# Patient Record
Sex: Female | Born: 1975 | Race: White | Hispanic: No | Smoking: Never smoker
Health system: Southern US, Community
[De-identification: ages and names within clinical notes are randomized; demographics above are authoritative.]

## PROBLEM LIST (undated history)

## (undated) DIAGNOSIS — E079 Disorder of thyroid, unspecified: Secondary | ICD-10-CM

## (undated) DIAGNOSIS — F32A Depression, unspecified: Secondary | ICD-10-CM

## (undated) DIAGNOSIS — F988 Other specified behavioral and emotional disorders with onset usually occurring in childhood and adolescence: Secondary | ICD-10-CM

## (undated) DIAGNOSIS — T7840XA Allergy, unspecified, initial encounter: Secondary | ICD-10-CM

## (undated) DIAGNOSIS — F419 Anxiety disorder, unspecified: Secondary | ICD-10-CM

## (undated) DIAGNOSIS — J45909 Unspecified asthma, uncomplicated: Secondary | ICD-10-CM

## (undated) HISTORY — DX: Other specified behavioral and emotional disorders with onset usually occurring in childhood and adolescence: F98.8

## (undated) HISTORY — DX: Unspecified asthma, uncomplicated: J45.909

## (undated) HISTORY — DX: Disorder of thyroid, unspecified: E07.9

## (undated) HISTORY — PX: BREAST BIOPSY: SHX20

## (undated) HISTORY — DX: Anxiety disorder, unspecified: F41.9

## (undated) HISTORY — DX: Depression, unspecified: F32.A

## (undated) HISTORY — PX: CHOLECYSTECTOMY: SHX55

## (undated) HISTORY — DX: Allergy, unspecified, initial encounter: T78.40XA

---

## 2005-05-01 ENCOUNTER — Ambulatory Visit: Payer: Self-pay | Admitting: Unknown Physician Specialty

## 2005-05-08 ENCOUNTER — Ambulatory Visit: Payer: Self-pay | Admitting: Surgery

## 2006-07-02 ENCOUNTER — Emergency Department: Payer: Self-pay | Admitting: Emergency Medicine

## 2006-07-02 ENCOUNTER — Other Ambulatory Visit: Payer: Self-pay

## 2006-12-24 ENCOUNTER — Emergency Department: Payer: Self-pay | Admitting: Emergency Medicine

## 2011-11-26 ENCOUNTER — Emergency Department: Payer: Self-pay | Admitting: *Deleted

## 2011-11-26 LAB — CBC
HCT: 44.3 % (ref 35.0–47.0)
HGB: 15 g/dL (ref 12.0–16.0)
MCHC: 33.8 g/dL (ref 32.0–36.0)
MCV: 87 fL (ref 80–100)
Platelet: 257 10*3/uL (ref 150–440)
RBC: 5.13 10*6/uL (ref 3.80–5.20)
WBC: 8.4 10*3/uL (ref 3.6–11.0)

## 2011-11-26 LAB — COMPREHENSIVE METABOLIC PANEL
Albumin: 3.9 g/dL (ref 3.4–5.0)
Alkaline Phosphatase: 45 U/L — ABNORMAL LOW (ref 50–136)
Bilirubin,Total: 0.4 mg/dL (ref 0.2–1.0)
Co2: 26 mmol/L (ref 21–32)
Creatinine: 0.7 mg/dL (ref 0.60–1.30)
Glucose: 106 mg/dL — ABNORMAL HIGH (ref 65–99)
Osmolality: 274 (ref 275–301)
Potassium: 3.5 mmol/L (ref 3.5–5.1)
SGPT (ALT): 24 U/L

## 2011-11-26 LAB — URINALYSIS, COMPLETE
Bacteria: NONE SEEN
Bilirubin,UR: NEGATIVE
Glucose,UR: NEGATIVE mg/dL (ref 0–75)
Ketone: NEGATIVE
Leukocyte Esterase: NEGATIVE
Ph: 7 (ref 4.5–8.0)
Protein: NEGATIVE
Squamous Epithelial: 1
WBC UR: NONE SEEN /HPF (ref 0–5)

## 2012-03-20 ENCOUNTER — Emergency Department: Payer: Self-pay | Admitting: Emergency Medicine

## 2012-03-20 LAB — BASIC METABOLIC PANEL
Anion Gap: 11 (ref 7–16)
BUN: 7 mg/dL (ref 7–18)
Chloride: 106 mmol/L (ref 98–107)
Creatinine: 0.62 mg/dL (ref 0.60–1.30)
EGFR (African American): >60
EGFR (Non-African Amer.): >60
Glucose: 85 mg/dL (ref 65–99)
Osmolality: 280 (ref 275–301)
Sodium: 142 mmol/L (ref 136–145)

## 2012-03-20 LAB — CBC
HCT: 43.4 % (ref 35.0–47.0)
HGB: 14.8 g/dL (ref 12.0–16.0)
MCH: 29.2 pg (ref 26.0–34.0)
MCHC: 34.2 g/dL (ref 32.0–36.0)
RBC: 5.08 10*6/uL (ref 3.80–5.20)

## 2012-03-20 LAB — CK TOTAL AND CKMB (NOT AT ARMC)
CK, Total: 155 U/L (ref 21–215)
CK-MB: 4.8 ng/mL — ABNORMAL HIGH (ref 0.5–3.6)

## 2012-03-20 LAB — TROPONIN I: Troponin-I: <0.02 ng/mL

## 2012-07-02 ENCOUNTER — Encounter: Payer: Self-pay | Admitting: Family Medicine

## 2012-07-02 ENCOUNTER — Ambulatory Visit (INDEPENDENT_AMBULATORY_CARE_PROVIDER_SITE_OTHER): Payer: 59 | Admitting: Family Medicine

## 2012-07-02 VITALS — BP 120/80 | HR 88 | Temp 97.9°F | Ht 64.25 in | Wt 146.0 lb

## 2012-07-02 DIAGNOSIS — F411 Generalized anxiety disorder: Secondary | ICD-10-CM

## 2012-07-02 DIAGNOSIS — F419 Anxiety disorder, unspecified: Secondary | ICD-10-CM

## 2012-07-02 DIAGNOSIS — Z1231 Encounter for screening mammogram for malignant neoplasm of breast: Secondary | ICD-10-CM

## 2012-07-02 DIAGNOSIS — Z9109 Other allergy status, other than to drugs and biological substances: Secondary | ICD-10-CM

## 2012-07-02 DIAGNOSIS — F32A Depression, unspecified: Secondary | ICD-10-CM | POA: Insufficient documentation

## 2012-07-02 DIAGNOSIS — Z0000 Encounter for general adult medical examination without abnormal findings: Secondary | ICD-10-CM

## 2012-07-02 DIAGNOSIS — Z136 Encounter for screening for cardiovascular disorders: Secondary | ICD-10-CM

## 2012-07-02 DIAGNOSIS — F329 Major depressive disorder, single episode, unspecified: Secondary | ICD-10-CM | POA: Insufficient documentation

## 2012-07-02 DIAGNOSIS — Z309 Encounter for contraceptive management, unspecified: Secondary | ICD-10-CM

## 2012-07-02 DIAGNOSIS — T7840XA Allergy, unspecified, initial encounter: Secondary | ICD-10-CM | POA: Insufficient documentation

## 2012-07-02 DIAGNOSIS — Z889 Allergy status to unspecified drugs, medicaments and biological substances status: Secondary | ICD-10-CM

## 2012-07-02 LAB — LIPID PANEL
Cholesterol: 224 mg/dL — ABNORMAL HIGH (ref 0–200)
HDL: 48.3 mg/dL (ref >39.00)
Triglycerides: 288 mg/dL — ABNORMAL HIGH (ref 0.0–149.0)
VLDL: 57.6 mg/dL — ABNORMAL HIGH (ref 0.0–40.0)

## 2012-07-02 LAB — COMPREHENSIVE METABOLIC PANEL
BUN: 11 mg/dL (ref 6–23)
CO2: 26 mEq/L (ref 19–32)
Calcium: 9.2 mg/dL (ref 8.4–10.5)
Chloride: 103 mEq/L (ref 96–112)
Creatinine, Ser: 0.7 mg/dL (ref 0.4–1.2)
GFR: 95.58 mL/min (ref >60.00)
Glucose, Bld: 102 mg/dL — ABNORMAL HIGH (ref 70–99)
Total Bilirubin: 0.4 mg/dL (ref 0.3–1.2)

## 2012-07-02 MED ORDER — FLUTICASONE PROPIONATE 50 MCG/ACT NA SUSP: 2.0000 | Freq: Every day | NASAL | Status: DC

## 2012-07-02 MED ORDER — ALPRAZOLAM 0.25 MG PO TABS: 0.2500 mg | ORAL_TABLET | Freq: Two times a day (BID) | ORAL | Status: DC | PRN

## 2012-07-02 MED ORDER — TRAZODONE HCL 50 MG PO TABS: 50.0000 mg | ORAL_TABLET | Freq: Every day | ORAL | Status: DC

## 2012-07-02 MED ORDER — LEVONORGESTREL-ETHINYL ESTRAD 0.15-30 MG-MCG PO TABS: 1.0000 | ORAL_TABLET | Freq: Every day | ORAL | Status: DC

## 2012-07-02 MED ORDER — FEXOFENADINE-PSEUDOEPHED ER 180-240 MG PO TB24: 1.0000 | ORAL_TABLET | Freq: Every day | ORAL | Status: DC

## 2012-07-02 NOTE — Patient Instructions (Addendum)
It was so nice to meet you. We will call you with your lab results. Please stop by to see Joanna Walker on your way out to set up your mammogram and Allergist referral.  Please call me in a few months to let me know how the birth control is working.

## 2012-07-02 NOTE — Progress Notes (Signed)
Subjective:    Patient ID: Joanna Walker, female    DOB: 1975-08-01, 37 y.o.   MRN: [id number]  HPI Very pleasant 37 yo G2P2 here for CPX and to establish care.  Per pt, had normal pap smear in 07/2011.  No h/o abnormal pap smears.  Has been on same OCP for years and frequently experiences spotting throughout the month.  Would like to try another OCP.  Allergies- per pt, has severe allergies to year round allergens and she is not quite sure what her triggers are.  Taking Allegra D but still has constant drainage and pressure.  No fever, cough or wheezes.  Has never been to an allergist.  Anxiety- h/o panic attacks.  She weaned herself off of Lexapro.  Currently going through a divorce and actually feels less anxious now.  Her two teenage sons are handling it well.  Has not had a panic attack in months.  Denies feeling anxious or depressed.  Patient Active Problem List  Diagnosis  . Anxiety  . Allergy  . Routine general medical examination at a health care facility   Past Medical History  Diagnosis Date  . Anxiety   . ADD (attention deficit disorder)   . Allergy    No past surgical history on file. History  Substance Use Topics  . Smoking status: Never Smoker   . Smokeless tobacco: Not on file  . Alcohol Use: Not on file   Family History  Problem Relation Age of Onset  . Adopted: Yes   No Known Allergies Current Outpatient Prescriptions on File Prior to Visit  Medication Sig Dispense Refill  . fluticasone (FLONASE) 50 MCG/ACT nasal spray Place 2 sprays into the nose daily.  16 g  2  . levonorgestrel-ethinyl estradiol (NORDETTE) 0.15-30 MG-MCG tablet Take 1 tablet by mouth daily.  1 Package  11  . traZODone (DESYREL) 50 MG tablet Take 1 tablet (50 mg total) by mouth at bedtime.  30 tablet  3   The PMH, PSH, Social History, Family History, Medications, and allergies have been reviewed in [facility], and have been updated if relevant.    Review of Systems    See HPI NO  dysuria No CP or SOB No changes in bowel habits No heat or cold intolerance No fatigue  Objective:   Physical Exam BP 120/80  Pulse 88  Temp 97.9 F (36.6 C)  Ht 5' 4.25" (1.632 m)  Wt 146 lb (66.225 kg)  BMI 24.87 kg/m2  General:  Well-developed,well-nourished,in no acute distress; alert,appropriate and cooperative throughout examination Head:  normocephalic and atraumatic.   Eyes:  vision grossly intact, pupils equal, pupils round, and pupils reactive to light.   Ears:  R ear normal and L ear normal.   Nose:  no external deformity.   +boggy turbinates, no sinus tenderness Mouth:  good dentition.   Neck:  No deformities, masses, or tenderness noted. Breasts:  No mass, nodules, thickening, tenderness, bulging, retraction, inflamation, nipple discharge or skin changes noted.   Lungs:  Normal respiratory effort, chest expands symmetrically. Lungs are clear to auscultation, no crackles or wheezes. Heart:  Normal rate and regular rhythm. S1 and S2 normal without gallop, murmur, click, rub or other extra sounds. Abdomen:  Bowel sounds positive,abdomen soft and non-tender without masses, organomegaly or hernias noted. Msk:  No deformity or scoliosis noted of thoracic or lumbar spine.   Extremities:  No clubbing, cyanosis, edema, or deformity noted with normal full range of motion of all joints.  Neurologic:  alert & oriented X3 and gait normal.   Skin:  Intact without suspicious lesions or rashes Cervical Nodes:  No lymphadenopathy noted Axillary Nodes:  No palpable lymphadenopathy Psych:  Cognition and judgment appear intact. Alert and cooperative with normal attention span and concentration. No apparent delusions, illusions, hallucinations        Assessment & Plan:   1. Other screening mammogram  Since she is adopted and does not know her family history, will refer for mammogram. MM Digital Screening  2. Routine general medical examination at a health care facility  Reviewed  preventive care protocols, scheduled due services, and updated immunizations Discussed nutrition, exercise, diet, and healthy lifestyle.  Due for pap smear in 2015.  Comprehensive metabolic panel  3. Screening for ischemic heart disease  Lipid Panel  4. Multiple allergies  Add flonase to antihistamine. Refer to allergist for evaluation/allergy testing. Ambulatory referral to Allergy  5. Anxiety  Stable off rx. Rx given for prn xanax for panic attacks.   6. Contraception management  Increase dose of estrogen to decrease spotting.  Follow up by phone in 2 months. The patient indicates understanding of these issues and agrees with the plan.

## 2012-07-16 ENCOUNTER — Telehealth: Payer: Self-pay

## 2012-07-16 MED ORDER — DIAZEPAM 5 MG PO TABS: 5.0000 mg | ORAL_TABLET | Freq: Two times a day (BID) | ORAL | Status: DC | PRN

## 2012-07-16 NOTE — Telephone Encounter (Signed)
Please call in rx for valium as entered below. 

## 2012-07-16 NOTE — Telephone Encounter (Signed)
Pt left v/m has had several panic attacks this week; pt saw Dr Dayton Martes 07/02/12 and given alprazolam for panic attacks. Pt took 2 alprazolam yesterday after panic attack and it really did not help. Pt request a different med for panic attacks to [facility] Pt said Dr Dayton Martes had said if alprazolam did not work would call in Valium.Please advise.pt request call back today.

## 2012-07-16 NOTE — Telephone Encounter (Signed)
[facility], advised patient.

## 2012-07-20 ENCOUNTER — Telehealth: Payer: Self-pay

## 2012-07-20 MED ORDER — AMPHETAMINE-DEXTROAMPHETAMINE 20 MG PO TABS: 20.0000 mg | ORAL_TABLET | Freq: Three times a day (TID) | ORAL | Status: DC

## 2012-07-20 NOTE — Telephone Encounter (Signed)
Pt left vm and pt seen recently and pt said discussed with Dr Dayton Martes about not taking ADD med for awhile to see how she does. Pt said not working well; pt not able to focus;pt taking courses at work and needs med for ADD. Before making appt since she had discussed with Dr Dayton Martes previously wanted note sent to Dr Dayton Martes to see if med could be given without being seen again.Please advise.

## 2012-07-20 NOTE — Telephone Encounter (Signed)
Rx printed

## 2012-07-20 NOTE — Telephone Encounter (Signed)
Script placed on doctor's desk for signature.  Pt wants to pick this up early tomorrow morning.

## 2012-07-20 NOTE — Telephone Encounter (Signed)
We do not have name or dosage of medication she was taking in our system.  What was she taking?

## 2012-07-20 NOTE — Telephone Encounter (Signed)
Pt states she was taking short acting adderall 20 mg's, one three times a day.  States this dose worked for her.

## 2012-07-21 NOTE — Telephone Encounter (Signed)
Rx signed and on my desk

## 2012-07-21 NOTE — Telephone Encounter (Signed)
Script given to patient

## 2012-07-30 ENCOUNTER — Ambulatory Visit: Payer: 59

## 2012-08-07 ENCOUNTER — Ambulatory Visit: Payer: 59

## 2012-08-11 ENCOUNTER — Ambulatory Visit: Payer: 59

## 2012-08-18 ENCOUNTER — Other Ambulatory Visit: Payer: Self-pay | Admitting: Family Medicine

## 2012-08-18 NOTE — Telephone Encounter (Signed)
[facility] 

## 2012-08-24 ENCOUNTER — Other Ambulatory Visit: Payer: Self-pay

## 2012-08-24 MED ORDER — AMPHETAMINE-DEXTROAMPHETAMINE 20 MG PO TABS: 20.0000 mg | ORAL_TABLET | Freq: Three times a day (TID) | ORAL | Status: DC

## 2012-08-24 NOTE — Telephone Encounter (Signed)
pt left v/m requesting rx adderall 20 mg. Call when ready for pick up. Pt is out of med and would like to pick up today.Please advise.

## 2012-08-24 NOTE — Telephone Encounter (Signed)
Script placed at front desk for pickup, advised patient.

## 2012-09-02 ENCOUNTER — Ambulatory Visit (INDEPENDENT_AMBULATORY_CARE_PROVIDER_SITE_OTHER): Payer: 59 | Admitting: Family Medicine

## 2012-09-02 ENCOUNTER — Encounter: Payer: Self-pay | Admitting: Family Medicine

## 2012-09-02 VITALS — BP 120/64 | HR 97 | Temp 98.4°F | Ht 64.25 in | Wt 145.0 lb

## 2012-09-02 DIAGNOSIS — M542 Cervicalgia: Secondary | ICD-10-CM

## 2012-09-02 MED ORDER — TIZANIDINE HCL 4 MG PO TABS: 4.0000 mg | ORAL_TABLET | Freq: Every evening | ORAL | Status: DC

## 2012-09-02 MED ORDER — TRAMADOL HCL 50 MG PO TABS: 50.0000 mg | ORAL_TABLET | Freq: Four times a day (QID) | ORAL | Status: DC | PRN

## 2012-09-02 NOTE — Progress Notes (Signed)
Nature conservation officer at [facility] [address] [place] [address] Phone: [phone] Fax: [phone]  Date:  09/02/2012   Name:  Joanna Walker   DOB:  11-30-1975   MRN:  [id number] Gender: female Age: 37 y.o.  Primary Physician:  Ruthe Mannan, MD  Evaluating MD: Hannah Beat, MD   Chief Complaint: Shoulder Pain and Neck Pain   History of Present Illness:  Joanna Walker is a 37 y.o. pleasant patient who presents with the following:  Dr. Dayton Martes asked me to evaluate the patient for neck pain.  Pain in the posterior of the left sided, and he has been seeing a chiropractor in [place] and also in [place].  Dr. Nyra Capes in [place].   L sided neck pain and no real shoulder pain - though originally described as shoulder pain.  She has been doing manipulation intermittently, which has historically helped. She works in a job where she works on [employer] a great deal of time.   No radiculopathy. No trauma.   No painful arc of motion. No prior interventions or surgery.  Patient Active Problem List  Diagnosis  . Anxiety  . Allergy  . Routine general medical examination at a health care facility  . Contraception management    Past Medical History  Diagnosis Date  . Anxiety   . ADD (attention deficit disorder)   . Allergy     No past surgical history on file.  History   Social History  . Marital Status: Legally Separated    Spouse Name: N/A    Number of Children: N/A  . Years of Education: N/A   Occupational History  . Not on file.   Social History Main Topics  . Smoking status: Never Smoker   . Smokeless tobacco: Not on file  . Alcohol Use: Not on file  . Drug Use: Not on file  . Sexually Active: Not on file   Other Topics Concern  . Not on file   Social History Narrative  . No narrative on file    Family History  Problem Relation Age of Onset  . Adopted: Yes    No Known Allergies  Medication list has been reviewed and  updated.  Outpatient Prescriptions Prior to Visit  Medication Sig Dispense Refill  . amphetamine-dextroamphetamine (ADDERALL) 20 MG tablet Take 1 tablet (20 mg total) by mouth 3 (three) times daily.  90 tablet  0  . diazepam (VALIUM) 5 MG tablet TAKE 1 TABLET BY MOUTH EVERY 12 HOURS AS NEEDED FOR ANXIETY  30 tablet  1  . fexofenadine-pseudoephedrine (ALLEGRA-D 24 HOUR) 180-240 MG per 24 hr tablet Take 1 tablet by mouth daily.      . fluticasone (FLONASE) 50 MCG/ACT nasal spray Place 2 sprays into the nose daily.  16 g  2  . levonorgestrel-ethinyl estradiol (NORDETTE) 0.15-30 MG-MCG tablet Take 1 tablet by mouth daily.  1 Package  11  . traZODone (DESYREL) 50 MG tablet Take 1 tablet (50 mg total) by mouth at bedtime.  30 tablet  3   No facility-administered medications prior to visit.    Review of Systems:   GEN: No fevers, chills. Nontoxic. Primarily MSK c/o today. MSK: Detailed in the HPI GI: tolerating PO intake without difficulty Neuro: No numbness, parasthesias, or tingling associated. Otherwise the pertinent positives of the ROS are noted above.    Physical Examination: BP 120/64  Pulse 97  Temp(Src) 98.4 F (36.9 C) (Oral)  Ht 5' 4.25" (1.632 m)  Wt 145 lb (65.772 kg)  BMI 24.69 kg/m2  SpO2 97%  Ideal Body Weight: Weight in (lb) to have BMI = 25: 146.5   GEN: Well-developed,well-nourished,in no acute distress; alert,appropriate and cooperative throughout examination HEENT: Normocephalic and atraumatic without obvious abnormalities. Ears, externally no deformities PULM: Breathing comfortably in no respiratory distress EXT: No clubbing, cyanosis, or edema PSYCH: Normally interactive. Cooperative during the interview. Pleasant. Friendly and conversant. Not anxious or depressed appearing. Normal, full affect.  CERVICAL SPINE EXAM Range of motion: Flexion, extension, lateral bending, and rotation: mild limitation looking and turning to the right Pain with terminal motion:  as above Spinous Processes: NT SCM: NT Upper paracervical muscles: Left upper posterior and lateral TTP Upper traps: NT C5-T1 intact, sensation and motor  Shoulder: B Inspection: No muscle wasting or winging Ecchymosis/edema: neg  AC joint, scapula, clavicle: NT Spurling's: neg Abduction: full, 5/5 Flexion: full, 5/5 IR, full, lift-off: 5/5 ER at neutral: full, 5/5 AC crossover and compression: neg Neer: neg Hawkins: neg Drop Test: neg Empty Can: neg Supraspinatus insertion: NT Bicipital groove: NT Speed's: neg Yergason's: neg Sulcus sign: neg Scapular dyskinesis: none C5-T1 intact Sensation intact Grip 5/5   Assessment and Plan:  Neck pain on left side  No shoulder pathology.  Classic posterior neck pain, left sided. Unlikely nerve impingement. Job placement looking at computer most likely culprit. Rec placing monitor higher.  Rec. Massage therapy, [facility] Chiropractic manipulation also a good idea  McKenzie protocol recommended - videos, recommended daily If not improving in a few weeks, recommended calling and I can set up with spine rehab unit at [facility] rehab.  Orders Today:  No orders of the defined types were placed in this encounter.    Updated Medication List: (Includes new medications, updates to list, dose adjustments) Meds ordered this encounter  Medications  . tiZANidine (ZANAFLEX) 4 MG tablet    Sig: Take 1 tablet (4 mg total) by mouth Nightly.    Dispense:  30 tablet    Refill:  2  . traMADol (ULTRAM) 50 MG tablet    Sig: Take 1 tablet (50 mg total) by mouth every 6 (six) hours as needed for pain.    Dispense:  50 tablet    Refill:  2    Medications Discontinued: There are no discontinued medications.    Signed, Elpidio Galea. Miel Wisener, MD 09/02/2012 4:42 PM

## 2012-09-02 NOTE — Patient Instructions (Addendum)
Look up on [employer] Protocol Cervical Spine  TRIAL OF MASSAGE THERAPY  JOEL [facility]ULL IN [place]

## 2012-09-14 ENCOUNTER — Ambulatory Visit: Payer: 59

## 2012-09-15 ENCOUNTER — Ambulatory Visit
Admission: RE | Admit: 2012-09-15 | Discharge: 2012-09-15 | Disposition: A | Discharge status: Home / Self Care | Payer: 59 | Source: Ambulatory Visit | Attending: Family Medicine | Admitting: Family Medicine

## 2012-09-15 DIAGNOSIS — Z1231 Encounter for screening mammogram for malignant neoplasm of breast: Secondary | ICD-10-CM

## 2012-09-22 ENCOUNTER — Other Ambulatory Visit: Payer: Self-pay | Admitting: *Deleted

## 2012-09-22 MED ORDER — DIAZEPAM 5 MG PO TABS: ORAL_TABLET | ORAL | Status: DC

## 2012-09-22 MED ORDER — FLUTICASONE PROPIONATE 50 MCG/ACT NA SUSP: 2.0000 | Freq: Every day | NASAL | Status: DC

## 2012-09-22 NOTE — Telephone Encounter (Signed)
Last filled 09/01/2012

## 2012-09-22 NOTE — Telephone Encounter (Signed)
[facility] 

## 2012-09-25 ENCOUNTER — Other Ambulatory Visit: Payer: Self-pay | Admitting: Family Medicine

## 2012-09-25 MED ORDER — AMPHETAMINE-DEXTROAMPHETAMINE 20 MG PO TABS: 20.0000 mg | ORAL_TABLET | Freq: Three times a day (TID) | ORAL | Status: DC

## 2012-09-25 NOTE — Telephone Encounter (Signed)
Script placed at front desk for pick up, advised patient.

## 2012-09-25 NOTE — Telephone Encounter (Signed)
Refill request for Adderall.  Please call when ready for pick up.

## 2012-10-19 ENCOUNTER — Other Ambulatory Visit: Payer: Self-pay | Admitting: *Deleted

## 2012-10-19 ENCOUNTER — Other Ambulatory Visit: Payer: Self-pay

## 2012-10-19 MED ORDER — AMPHETAMINE-DEXTROAMPHETAMINE 20 MG PO TABS: 20.0000 mg | ORAL_TABLET | Freq: Three times a day (TID) | ORAL | Status: DC

## 2012-10-19 MED ORDER — TRAZODONE HCL 50 MG PO TABS: 50.0000 mg | ORAL_TABLET | Freq: Every day | ORAL | Status: DC

## 2012-10-19 NOTE — Telephone Encounter (Signed)
Pt had missed call; advised pt rx is ready for pick up at front desk.

## 2012-10-19 NOTE — Telephone Encounter (Signed)
Last filled 09/21/12 

## 2012-10-19 NOTE — Telephone Encounter (Signed)
Pt request rx adderall. Call when ready for pick up. Pt needs to pick up by 10/21/12; going out of town on business.

## 2012-11-07 ENCOUNTER — Other Ambulatory Visit: Payer: Self-pay | Admitting: Family Medicine

## 2012-11-10 NOTE — Telephone Encounter (Signed)
[facility] 

## 2012-11-11 NOTE — Telephone Encounter (Signed)
Pt called ck status of valium refill; spoke with Mimi at [facility] and rx ready for pick up. Pt advised.

## 2012-11-17 ENCOUNTER — Other Ambulatory Visit: Payer: Self-pay | Admitting: Family Medicine

## 2012-11-19 ENCOUNTER — Other Ambulatory Visit: Payer: Self-pay

## 2012-11-19 ENCOUNTER — Encounter: Payer: Self-pay | Admitting: Family Medicine

## 2012-11-19 MED ORDER — AMPHETAMINE-DEXTROAMPHETAMINE 20 MG PO TABS: 20.0000 mg | ORAL_TABLET | Freq: Three times a day (TID) | ORAL | Status: DC

## 2012-11-19 NOTE — Telephone Encounter (Signed)
Advised patient script is ready for pick up. 

## 2012-11-19 NOTE — Telephone Encounter (Signed)
Pt request rx adderall.call when ready for pick up. Pt is out of med and request pick up today if possible.

## 2012-11-30 ENCOUNTER — Encounter: Payer: Self-pay | Admitting: Family Medicine

## 2012-12-01 ENCOUNTER — Other Ambulatory Visit: Payer: Self-pay | Admitting: Family Medicine

## 2012-12-01 NOTE — Telephone Encounter (Signed)
[facility] 

## 2012-12-15 ENCOUNTER — Other Ambulatory Visit: Payer: Self-pay | Admitting: Family Medicine

## 2012-12-16 ENCOUNTER — Other Ambulatory Visit: Payer: Self-pay

## 2012-12-16 NOTE — Telephone Encounter (Signed)
Pt left v/m requesting rx for Adderall. Call when ready for pick up. Pt will be out of med on 12/18/12.

## 2012-12-16 NOTE — Telephone Encounter (Signed)
Ok to print out and place on my desk for signature. 

## 2012-12-17 MED ORDER — AMPHETAMINE-DEXTROAMPHETAMINE 20 MG PO TABS: 20.0000 mg | ORAL_TABLET | Freq: Three times a day (TID) | ORAL | Status: DC

## 2012-12-17 NOTE — Telephone Encounter (Signed)
Left message advising patient script is ready for pick up. 

## 2012-12-29 ENCOUNTER — Other Ambulatory Visit: Payer: Self-pay | Admitting: Family Medicine

## 2012-12-29 NOTE — Telephone Encounter (Signed)
rx called to pharmacy 

## 2013-01-07 ENCOUNTER — Other Ambulatory Visit: Payer: Self-pay

## 2013-01-07 NOTE — Telephone Encounter (Signed)
Ok to print and put in my box for signature. 

## 2013-01-07 NOTE — Telephone Encounter (Signed)
Pt left v/m requesting adderall rx. Call when ready for pick up.

## 2013-01-08 MED ORDER — AMPHETAMINE-DEXTROAMPHETAMINE 20 MG PO TABS: 20.0000 mg | ORAL_TABLET | Freq: Three times a day (TID) | ORAL | Status: DC

## 2013-01-08 NOTE — Telephone Encounter (Signed)
Advised patient script is ready for pick up at front desk. 

## 2013-01-13 ENCOUNTER — Encounter: Payer: Self-pay | Admitting: Family Medicine

## 2013-01-13 ENCOUNTER — Encounter: Payer: Self-pay | Admitting: Radiology

## 2013-01-13 ENCOUNTER — Ambulatory Visit (INDEPENDENT_AMBULATORY_CARE_PROVIDER_SITE_OTHER): Payer: 59 | Admitting: Family Medicine

## 2013-01-13 ENCOUNTER — Encounter: Payer: Self-pay | Admitting: *Deleted

## 2013-01-13 VITALS — BP 118/82 | HR 93 | Temp 98.5°F | Ht 64.25 in | Wt 142.2 lb

## 2013-01-13 DIAGNOSIS — G43109 Migraine with aura, not intractable, without status migrainosus: Secondary | ICD-10-CM

## 2013-01-13 MED ORDER — SUMATRIPTAN SUCCINATE 50 MG PO TABS: ORAL_TABLET | ORAL | Status: DC

## 2013-01-13 MED ORDER — PROMETHAZINE HCL 50 MG/ML IJ SOLN
50.0000 mg | Freq: Once | INTRAMUSCULAR | Status: AC
  Administered 2013-01-13: 50 mg via INTRAMUSCULAR

## 2013-01-13 MED ORDER — KETOROLAC TROMETHAMINE 30 MG/ML IJ SOLN
30.0000 mg | Freq: Once | INTRAMUSCULAR | Status: AC
  Administered 2013-01-13: 30 mg via INTRAMUSCULAR

## 2013-01-13 NOTE — Patient Instructions (Addendum)
For headache today - toradol and phenergan injections Go home / drink water and rest /sleep  I sent px for low dose imitrex to the pharmacy

## 2013-01-13 NOTE — Progress Notes (Signed)
Subjective:    Patient ID: Joanna Walker, female    DOB: June 04, 1976, 36 y.o.   MRN: [id number]  HPI Here for a headache  Migraine for 2 days (coming and going) with nausea and light sensitivity Varies in intensity   Hx of chronic migraine  Her big trigger is generally stress Has not taken any meds today  otc - ibuprofen (helps depending on the severity)   Headache is top of head and sides now - most of the time it starts on one side first Is throbbing Worse with exertion  Has been stressed   Yesterday she did vomit several times   No facial droop or eye tearing   Did not work yesterday or today  Not on menses now - and no chance pregnant-has not missed doses of OC   Was on imitrex in the past from her prev doctor-has not had in a while / will usually help if she can lie down    She has valium at home to take at night - as needed (last dose was a week ago) Has zanaflex for neck spasm to take prn - last dose 7-8 days  Has tramadol for pain - for neck pain also - does not take it - makes her sick (vomits)    Patient Active Problem List   Diagnosis Date Noted  . Routine general medical examination at a health care facility 07/02/2012  . Contraception management 07/02/2012  . Anxiety   . Allergy    Past Medical History  Diagnosis Date  . Anxiety   . ADD (attention deficit disorder)   . Allergy    No past surgical history on file. History  Substance Use Topics  . Smoking status: Never Smoker   . Smokeless tobacco: Not on file  . Alcohol Use: Yes     Comment: rare   Family History  Problem Relation Age of Onset  . Adopted: Yes   No Known Allergies Current Outpatient Prescriptions on File Prior to Visit  Medication Sig Dispense Refill  . amphetamine-dextroamphetamine (ADDERALL) 20 MG tablet Take 1 tablet (20 mg total) by mouth 3 (three) times daily.  90 tablet  0  . diazepam (VALIUM) 5 MG tablet TAKE 1 TABLET BY MOUTH EVERY 12 HOURS AS NEEDED FOR ANXIETY   30 tablet  1  . fexofenadine-pseudoephedrine (ALLEGRA-D 24 HOUR) 180-240 MG per 24 hr tablet Take 1 tablet by mouth daily.      . fluticasone (FLONASE) 50 MCG/ACT nasal spray Place 2 sprays into the nose daily.  16 g  2  . levonorgestrel-ethinyl estradiol (NORDETTE) 0.15-30 MG-MCG tablet Take 1 tablet by mouth daily.  1 Package  11  . tiZANidine (ZANAFLEX) 4 MG tablet TAKE 1 TABLET BY MOUTH EVERY DAY AT NIGHT  30 tablet  2  . tiZANidine (ZANAFLEX) 4 MG tablet TAKE 1 TABLET BY MOUTH EVERY DAY AT NIGHT  30 tablet  2  . traZODone (DESYREL) 50 MG tablet Take 1 tablet (50 mg total) by mouth at bedtime.  30 tablet  3  . diazepam (VALIUM) 5 MG tablet TAKE 1 TABLET BY MOUTH EVERY 12 HOURS AS NEEDED FOR ANXIETY  30 tablet  0  . diazepam (VALIUM) 5 MG tablet TAKE 1 TABLET BY MOUTH EVERY 12 HOURS AS NEEDED FOR ANXIETY  30 tablet  0  . diazepam (VALIUM) 5 MG tablet TAKE 1 TABLET BY MOUTH EVERY 12 HOURS AS NEEDED FOR ANXIETY  30 tablet  0  . traMADol (ULTRAM)  50 MG tablet Take 1 tablet (50 mg total) by mouth every 6 (six) hours as needed for pain.  50 tablet  2   No current facility-administered medications on file prior to visit.    Review of Systems Review of Systems  Constitutional: Negative for fever, appetite change, fatigue and unexpected weight change.  Eyes: Negative for pain and visual disturbance.  Respiratory: Negative for cough and shortness of breath.   Cardiovascular: Negative for cp or palpitations    Gastrointestinal: Negative for nausea, diarrhea and constipation.  Genitourinary: Negative for urgency and frequency.  Skin: Negative for pallor or rash   Neurological: Negative for weakness, light-headedness, numbness and pos for headache Hematological: Negative for adenopathy. Does not bruise/bleed easily.  Psychiatric/Behavioral: Negative for dysphoric mood. The patient is not nervous/anxious.         Objective:   Physical Exam  Constitutional: She appears well-developed and  well-nourished. No distress.  In discomfort but not distressed   HENT:  Head: Normocephalic and atraumatic.  Mouth/Throat: Oropharynx is clear and moist.  Eyes: Conjunctivae and EOM are normal. Pupils are equal, round, and reactive to light. Right eye exhibits no discharge. Left eye exhibits no discharge. No scleral icterus.  Neck: Normal range of motion. Neck supple. No JVD present. No thyromegaly present.  Cardiovascular: Normal rate, regular rhythm, normal heart sounds and intact distal pulses.   Pulmonary/Chest: Effort normal and breath sounds normal. No respiratory distress. She has no wheezes.  Lymphadenopathy:    She has no cervical adenopathy.  Neurological: She is alert. She has normal reflexes. She displays no atrophy and no tremor. No cranial nerve deficit or sensory deficit. She exhibits normal muscle tone. Coordination and gait normal.  No cerebellar signs   Skin: Skin is warm and dry. No rash noted. No erythema. No pallor.  Psychiatric: She has a normal mood and affect.          Assessment & Plan:

## 2013-01-14 NOTE — Assessment & Plan Note (Signed)
For acute symptoms -toradol 30 and phenergan 50 mg  inst to hydrate Rev lifestyle issues for prev migraine Sounds like she has had features of cluster ha in past -but do not believe this is the case today Px for low dose imitrex oral to use at home prn -has worked in the past Reassuring exam Will update if not imp

## 2013-01-25 ENCOUNTER — Other Ambulatory Visit: Payer: Self-pay | Admitting: Family Medicine

## 2013-01-25 NOTE — Telephone Encounter (Signed)
plz phone in. 

## 2013-01-26 NOTE — Telephone Encounter (Signed)
[facility] 

## 2013-02-03 ENCOUNTER — Other Ambulatory Visit: Payer: Self-pay | Admitting: Family Medicine

## 2013-02-08 ENCOUNTER — Other Ambulatory Visit: Payer: Self-pay | Admitting: Family Medicine

## 2013-02-08 MED ORDER — AMPHETAMINE-DEXTROAMPHETAMINE 20 MG PO TABS: 20.0000 mg | ORAL_TABLET | Freq: Three times a day (TID) | ORAL | Status: DC

## 2013-02-08 NOTE — Telephone Encounter (Signed)
Pt requesting Adderall refill.  Last filled 01/07/13.

## 2013-02-08 NOTE — Telephone Encounter (Signed)
Advised patient script is ready for pick up. 

## 2013-02-18 ENCOUNTER — Other Ambulatory Visit: Payer: Self-pay | Admitting: Family Medicine

## 2013-02-18 NOTE — Telephone Encounter (Signed)
[facility] 

## 2013-02-18 NOTE — Telephone Encounter (Signed)
plz phone in. 

## 2013-02-22 ENCOUNTER — Telehealth: Payer: Self-pay

## 2013-02-22 NOTE — Telephone Encounter (Signed)
No I do not feel comfortable increasing dose but we will discuss further at her appt.

## 2013-02-22 NOTE — Telephone Encounter (Signed)
Pt said she does not think Diazepam is working. Pt taking more than before, pt taking Diazepam daily or twice daily; pt does not want to go on antidepressant. Pt has had changes at work that has increased her stress level; pt feels very anxious. Pt wants to know if Diazepam could be increased or change back to higher dose of Xanax. Pt wonders if she might be starting panic attacks. Pt scheduled appt with Dr Dayton Martes on 02/23/13 at 12 noon. No SI/HI.

## 2013-02-23 ENCOUNTER — Ambulatory Visit (INDEPENDENT_AMBULATORY_CARE_PROVIDER_SITE_OTHER): Payer: 59 | Admitting: Family Medicine

## 2013-02-23 ENCOUNTER — Encounter: Payer: Self-pay | Admitting: Family Medicine

## 2013-02-23 VITALS — BP 120/82 | HR 72 | Temp 97.6°F | Wt 147.0 lb

## 2013-02-23 DIAGNOSIS — F411 Generalized anxiety disorder: Secondary | ICD-10-CM

## 2013-02-23 DIAGNOSIS — F988 Other specified behavioral and emotional disorders with onset usually occurring in childhood and adolescence: Secondary | ICD-10-CM | POA: Insufficient documentation

## 2013-02-23 DIAGNOSIS — F419 Anxiety disorder, unspecified: Secondary | ICD-10-CM

## 2013-02-23 MED ORDER — BUSPIRONE HCL 30 MG PO TABS: 15.0000 mg | ORAL_TABLET | Freq: Two times a day (BID) | ORAL | Status: DC

## 2013-02-23 MED ORDER — AMPHETAMINE-DEXTROAMPHET ER 30 MG PO CP24: 30.0000 mg | ORAL_CAPSULE | ORAL | Status: DC

## 2013-02-23 NOTE — Progress Notes (Signed)
Subjective:    Patient ID: Joanna Walker, female    DOB: 1976-02-24, 37 y.o.   MRN: [id number]  HPI Very pleasant 37 yo G2P2 here to discuss anxiety.  I have not seen her since she establish care in 06/2012.   Anxiety- h/o panic attacks.  She weaned herself off of Lexapro.  She "hated the side effects"- decreased sex drive, weight gain.  Has been on others in past.  In January, rx given for alprazolam to use prn panic attacks which were not very frequent at that time.  She called back a couple of weeks later, asking for valium instead since xanax was not working well.  She is now taking this regularly twice daily and wants to increase dose.  Not having panic attacks but feels constantly anxious. Job is more stressful.  Denies feeling depressed.  Feels ADD symptoms worsened as well now with added responsibility at work. Patient Active Problem List   Diagnosis Date Noted  . Migraine with aura 01/13/2013  . Anxiety   . Allergy    Past Medical History  Diagnosis Date  . Anxiety   . ADD (attention deficit disorder)   . Allergy    No past surgical history on file. History  Substance Use Topics  . Smoking status: Never Smoker   . Smokeless tobacco: Not on file  . Alcohol Use: Yes     Comment: rare   Family History  Problem Relation Age of Onset  . Adopted: Yes   No Known Allergies Current Outpatient Prescriptions on File Prior to Visit  Medication Sig Dispense Refill  . amphetamine-dextroamphetamine (ADDERALL) 20 MG tablet Take 1 tablet (20 mg total) by mouth 3 (three) times daily.  90 tablet  0  . diazepam (VALIUM) 5 MG tablet TAKE 1 TABLET BY MOUTH EVERY 12 HOURS AS NEEDED FOR ANXIETY  30 tablet  1  . diazepam (VALIUM) 5 MG tablet TAKE 1 TABLET BY MOUTH EVERY 12 HOURS AS NEEDED FOR ANXIETY  30 tablet  0  . diazepam (VALIUM) 5 MG tablet TAKE 1 TABLET BY MOUTH EVERY 12 HOURS AS NEEDED FOR ANXIETY  30 tablet  0  . diazepam (VALIUM) 5 MG tablet TAKE 1 TABLET BY MOUTH EVERY  12 HOURS AS NEEDED FOR ANXIETY  30 tablet  0  . diazepam (VALIUM) 5 MG tablet TAKE 1 TABLET EVERY 12 HOURS AS NEEDED FOR ANXIETY  30 tablet  0  . diazepam (VALIUM) 5 MG tablet TAKE 1 TABLET EVERY 12 HOURS AS NEEDED FOR ANXIETY  30 tablet  0  . fexofenadine-pseudoephedrine (ALLEGRA-D 24 HOUR) 180-240 MG per 24 hr tablet Take 1 tablet by mouth daily.      . fluticasone (FLONASE) 50 MCG/ACT nasal spray PLACE 2 SPRAYS INTO THE NOSE DAILY.  16 g  5  . levonorgestrel-ethinyl estradiol (NORDETTE) 0.15-30 MG-MCG tablet Take 1 tablet by mouth daily.  1 Package  11  . SUMAtriptan (IMITREX) 50 MG tablet Take 1 pill by mouth for migraine as needed, maximum one dose per day  10 tablet  0  . tiZANidine (ZANAFLEX) 4 MG tablet TAKE 1 TABLET BY MOUTH EVERY DAY AT NIGHT  30 tablet  2  . tiZANidine (ZANAFLEX) 4 MG tablet TAKE 1 TABLET BY MOUTH EVERY DAY AT NIGHT  30 tablet  2  . traMADol (ULTRAM) 50 MG tablet Take 1 tablet (50 mg total) by mouth every 6 (six) hours as needed for pain.  50 tablet  2  . traZODone (DESYREL)  50 MG tablet TAKE 1 TABLET (50 MG TOTAL) BY MOUTH AT BEDTIME.  30 tablet  3   No current facility-administered medications on file prior to visit.   The PMH, PSH, Social History, Family History, Medications, and allergies have been reviewed in [facility], and have been updated if relevant.    Review of Systems    See HPI  Objective:   Physical Exam BP 120/82  Pulse 72  Temp(Src) 97.6 F (36.4 C)  Wt 147 lb (66.679 kg)  BMI 25.04 kg/m2  General:  Well-developed,well-nourished,in no acute distress; alert,appropriate and cooperative throughout examination Head:  normocephalic and atraumatic.   Psych:  Cognition and judgment appear intact. Alert and cooperative with normal attention span and concentration. No apparent delusions, illusions, hallucinations      Assessment & Plan:   1. Anxiety Deteriorated.  Explained to her that chronic benzo use is not recommended for treatment of GAD.  I  would recommend SSRI but she declined.  Also declined psychotherapy.  She is willing to start Buspar- will start 15 mg twice daily. The patient indicates understanding of these issues and agrees with the plan.   2. ADD (attention deficit disorder) Deteriorated. Increase to 30 mg XR Adderall daily.

## 2013-02-23 NOTE — Patient Instructions (Addendum)
Good to see you. We are starting Buspar 15 mg twice daily.  Please call me in a few weeks with an update.

## 2013-03-18 ENCOUNTER — Telehealth: Payer: Self-pay | Admitting: *Deleted

## 2013-03-18 MED ORDER — AMPHETAMINE-DEXTROAMPHETAMINE 30 MG PO TABS
30.0000 mg | ORAL_TABLET | Freq: Every day | ORAL | Status: DC
Start: 2013-03-18 — End: 2013-03-19

## 2013-03-18 NOTE — Telephone Encounter (Signed)
Rx printed

## 2013-03-18 NOTE — Telephone Encounter (Signed)
Pt advise regular adderall  Prescribed and Rx ready for pick up

## 2013-03-18 NOTE — Telephone Encounter (Signed)
Pt is requesting to switch back to regular adderall, per pt the XR is causing her not to sleep at night. Please advise

## 2013-03-19 MED ORDER — AMPHETAMINE-DEXTROAMPHETAMINE 20 MG PO TABS: 20.0000 mg | ORAL_TABLET | Freq: Three times a day (TID) | ORAL | Status: DC

## 2013-03-19 NOTE — Addendum Note (Signed)
Addended by: Dianne Dun on: 03/19/2013 08:09 AM   Modules accepted: Orders

## 2013-03-19 NOTE — Addendum Note (Signed)
Addended by: Sueanne Margarita on: 03/19/2013 09:56 AM   Modules accepted: Orders

## 2013-03-19 NOTE — Telephone Encounter (Signed)
I apologize it was not printed correctly.  Ok to print out and either ask another physician to sign or leave on my desk for signature.  I will be in office hopefully by 11:30.

## 2013-03-19 NOTE — Telephone Encounter (Signed)
rx printed and pt aware it's ready for pick-up

## 2013-03-19 NOTE — Addendum Note (Signed)
Addended by: Sueanne Margarita on: 03/19/2013 08:01 AM   Modules accepted: Orders, Medications

## 2013-03-19 NOTE — Telephone Encounter (Signed)
Pt came in this morning to pick up rx and was VERY upset that her rx was not correct, ( she wasn't nice to [place] [address])  I spoke to pt and she is requesting the Adderall 20mg  1 tablet three times daily. I advised Dr. Dayton Martes wasn't in and that I could get another physician to handle this but she couldn't wait and stated she will be back at lunch time. I have the incorrect rx on my desk.

## 2013-03-29 ENCOUNTER — Other Ambulatory Visit: Payer: Self-pay | Admitting: Family Medicine

## 2013-03-29 NOTE — Telephone Encounter (Signed)
Received refill request electronically. Last office visit 02/23/13. Is it okay to refill?

## 2013-04-19 ENCOUNTER — Other Ambulatory Visit: Payer: Self-pay | Admitting: Family Medicine

## 2013-04-19 NOTE — Telephone Encounter (Signed)
Ok to refill 

## 2013-04-19 NOTE — Telephone Encounter (Signed)
Last office visit 02/23/2013.  Ok to refill? 

## 2013-04-22 ENCOUNTER — Other Ambulatory Visit: Payer: Self-pay

## 2013-04-22 NOTE — Telephone Encounter (Signed)
Pt left v/m requesting rx adderall 20 mg. Call when ready for pickup. Request pick up 04/23/13.

## 2013-04-23 MED ORDER — AMPHETAMINE-DEXTROAMPHETAMINE 20 MG PO TABS: 20.0000 mg | ORAL_TABLET | Freq: Three times a day (TID) | ORAL | Status: DC

## 2013-04-23 NOTE — Telephone Encounter (Signed)
Printed, signed and placed in my out box

## 2013-04-23 NOTE — Telephone Encounter (Signed)
Informed pt that RX ready to be picked up at front desk. 

## 2013-05-13 ENCOUNTER — Other Ambulatory Visit: Payer: Self-pay | Admitting: Family Medicine

## 2013-05-24 ENCOUNTER — Other Ambulatory Visit: Payer: Self-pay | Admitting: Family Medicine

## 2013-05-24 NOTE — Telephone Encounter (Signed)
Last office visit 02/23/2013.  Ok to refill? 

## 2013-05-31 ENCOUNTER — Telehealth: Payer: Self-pay

## 2013-05-31 ENCOUNTER — Other Ambulatory Visit: Payer: Self-pay

## 2013-05-31 MED ORDER — AMPHETAMINE-DEXTROAMPHETAMINE 20 MG PO TABS: 20.0000 mg | ORAL_TABLET | Freq: Three times a day (TID) | ORAL | Status: DC

## 2013-05-31 NOTE — Telephone Encounter (Signed)
Pt left v/m requesting rx adderall. Pt out of med and would like to pick up today. Call when ready for pick up.

## 2013-05-31 NOTE — Telephone Encounter (Signed)
Left message on voicemail for pt to return call Rx left in front office for pick up

## 2013-06-09 ENCOUNTER — Other Ambulatory Visit: Payer: Self-pay | Admitting: Family Medicine

## 2013-06-22 ENCOUNTER — Emergency Department: Payer: Self-pay | Admitting: Emergency Medicine

## 2013-06-22 LAB — CBC
HCT: 41 % (ref 35.0–47.0)
HGB: 13.9 g/dL (ref 12.0–16.0)
MCH: 29.5 pg (ref 26.0–34.0)
MCHC: 34 g/dL (ref 32.0–36.0)
MCV: 87 fL (ref 80–100)
Platelet: 265 10*3/uL (ref 150–440)
RBC: 4.73 10*6/uL (ref 3.80–5.20)
RDW: 12.8 % (ref 11.5–14.5)
WBC: 11.4 10*3/uL — ABNORMAL HIGH (ref 3.6–11.0)

## 2013-06-22 LAB — BASIC METABOLIC PANEL
Anion Gap: 4 — ABNORMAL LOW (ref 7–16)
BUN: 8 mg/dL (ref 7–18)
CALCIUM: 8.9 mg/dL (ref 8.5–10.1)
Chloride: 103 mmol/L (ref 98–107)
Co2: 27 mmol/L (ref 21–32)
Creatinine: 0.66 mg/dL (ref 0.60–1.30)
GLUCOSE: 86 mg/dL (ref 65–99)
Osmolality: 266 (ref 275–301)
Potassium: 3.6 mmol/L (ref 3.5–5.1)
SODIUM: 134 mmol/L — AB (ref 136–145)

## 2013-06-22 LAB — TROPONIN I: Troponin-I: <0.02 ng/mL

## 2013-06-23 ENCOUNTER — Telehealth: Payer: Self-pay | Admitting: Family Medicine

## 2013-06-23 LAB — TROPONIN I: Troponin-I: <0.02 ng/mL

## 2013-06-23 NOTE — Telephone Encounter (Signed)
I am not here this pm.  She needs to go to ER or urgent care if no other office visits available here.

## 2013-06-23 NOTE — Telephone Encounter (Signed)
Spoke to pt and informed her that Dr Dayton MartesAron is not available this pm; advised as instructed.

## 2013-06-23 NOTE — Telephone Encounter (Signed)
Patient Information:  Caller Name: Victorino DikeJennifer  Phone: [phone]  Patient: Joanna Walker, Joanna Walker  Gender: Female  DOB: 11/22/1975  Age: 38 Years  PCP: Ruthe MannanAron, [facility](Family Practice)  Pregnant: No  Office Follow Up:  Does the office need to follow up with this patient?: Yes  Instructions For The Office: No appointments available at  [facility] or [facility] offices.  Patient was previously evaluated at ED and told to follow up with PCP  06/23/13.   Symptoms  Reason For Call & Symptoms: Patient reports she went to [facility] ED on 06/22/13 and told to follow up with PCP 06/23/13.  She had EKG, labs and , CXR.  Caller states she was discharged on the condition of following up with PCP 06/23/13.  Heart rate was elevated; no indication of blood clot.    Chest pain rated at 6-7 of 10, constant in right upper chest and under arm.  Caller reports it feels like pressure with breathing.  Denies coughing.  Emergent symptoms ruled otu.  Go to ED Now or to Office with PCP Approval.  Reviewed Health History In EMR: Yes  Reviewed Medications In EMR: Yes  Reviewed Allergies In EMR: Yes  Reviewed Surgeries / Procedures: Yes  Date of Onset of Symptoms: 06/22/2013  Treatments Tried: Ibuprofen, Acid Reducer  Treatments Tried Worked: No OB / GYN:  LMP: Unknown  Guideline(s) Used:  Chest Pain  Disposition Per Guideline:   Go to ED Now (or to Office with PCP Approval)  Reason For Disposition Reached:   Chest pain lasting longer than 5 minutes  Advice Given:  N/A  RN Overrode Recommendation:  Document Patient  No appointments available at  [facility] or [facility] offices.  Patient was previously evaluated at ED and told to follow up with PCP  06/23/13.

## 2013-06-24 ENCOUNTER — Emergency Department: Payer: Self-pay | Admitting: Emergency Medicine

## 2013-06-24 LAB — TROPONIN I

## 2013-06-24 LAB — BASIC METABOLIC PANEL
Anion Gap: 4 — ABNORMAL LOW (ref 7–16)
BUN: 12 mg/dL (ref 7–18)
CALCIUM: 8.5 mg/dL (ref 8.5–10.1)
CHLORIDE: 108 mmol/L — AB (ref 98–107)
Co2: 26 mmol/L (ref 21–32)
Creatinine: 0.76 mg/dL (ref 0.60–1.30)
EGFR (Non-African Amer.): >60
Glucose: 117 mg/dL — ABNORMAL HIGH (ref 65–99)
OSMOLALITY: 276 (ref 275–301)
POTASSIUM: 3.5 mmol/L (ref 3.5–5.1)
Sodium: 138 mmol/L (ref 136–145)

## 2013-06-24 LAB — CBC
HCT: 39.2 % (ref 35.0–47.0)
HGB: 13.6 g/dL (ref 12.0–16.0)
MCH: 30 pg (ref 26.0–34.0)
MCHC: 34.6 g/dL (ref 32.0–36.0)
MCV: 87 fL (ref 80–100)
Platelet: 249 10*3/uL (ref 150–440)
RBC: 4.52 10*6/uL (ref 3.80–5.20)
RDW: 12.9 % (ref 11.5–14.5)
WBC: 8.1 10*3/uL (ref 3.6–11.0)

## 2013-06-25 ENCOUNTER — Ambulatory Visit (INDEPENDENT_AMBULATORY_CARE_PROVIDER_SITE_OTHER): Payer: 59 | Admitting: Family Medicine

## 2013-06-25 ENCOUNTER — Encounter: Payer: Self-pay | Admitting: Family Medicine

## 2013-06-25 VITALS — BP 112/68 | HR 90 | Temp 97.9°F | Ht 64.0 in | Wt 139.0 lb

## 2013-06-25 DIAGNOSIS — R079 Chest pain, unspecified: Secondary | ICD-10-CM

## 2013-06-25 MED ORDER — AMPHETAMINE-DEXTROAMPHETAMINE 30 MG PO TABS: 30.0000 mg | ORAL_TABLET | Freq: Two times a day (BID) | ORAL | Status: DC

## 2013-06-25 MED ORDER — TRAZODONE HCL 100 MG PO TABS: ORAL_TABLET | ORAL | Status: DC

## 2013-06-25 NOTE — Patient Instructions (Signed)
Costochondritis Costochondritis (Tietze syndrome), or costochondral separation, is a swelling and irritation (inflammation) of the tissue (cartilage) that connects your ribs with your breastbone (sternum). It may occur on its own (spontaneously), through damage caused by an accident (trauma), or simply from coughing or minor exercise. It may take up to 6 weeks to get better and longer if you are unable to be conservative in your activities. HOME CARE INSTRUCTIONS   Avoid exhausting physical activity. Try not to strain your ribs during normal activity. This would include any activities using chest, belly (abdominal), and side muscles, especially if heavy weights are used.  Use ice for 15-20 minutes per hour while awake for the first 2 days. Place the ice in a plastic bag, and place a towel between the bag of ice and your skin.  Only take over-the-counter or prescription medicines for pain, discomfort, or fever as directed by your caregiver. SEEK IMMEDIATE MEDICAL CARE IF:   Your pain increases or you are very uncomfortable.  You have a fever.  You develop difficulty with your breathing.  You cough up blood.  You develop worse chest pains, shortness of breath, sweating, or vomiting.  You develop new, unexplained problems (symptoms). MAKE SURE YOU:   Understand these instructions.  Will watch your condition.  Will get help right away if you are not doing well or get worse. Document Released: 03/13/2005 Document Revised: 08/26/2011 Document Reviewed: 01/05/2013 [facility] Patient Information 2014 [place] [place].

## 2013-06-25 NOTE — Progress Notes (Signed)
Pre-visit discussion using our clinic review tool. No additional management support is needed unless otherwise documented below in the visit note.  

## 2013-06-25 NOTE — Progress Notes (Signed)
   Subjective:    Patient ID: Joanna Walker, female    DOB: 05-14-1976, 38 y.o.   MRN: [id number]  HPI  38 yo here for ER follow up.  Notes reviewed.  [facility] for right sided chest pain- pain reproducible and worse with deep inspirations, cough or arm movement.  Did have URI a couple of weeks ago but no URI symptoms now.  Left before seen by MD but we do have labs- neg d dimer, neg trop, EKG NSR, neg CXR.  Returned to ER again last night because experienced a sharper pain which has since resolved.  CTA of chest negative.  Given Ketoralac- has only taken one dose.  No SOB except when "pain takes her breath away."  No fevers.  No n/v/d. Patient Active Problem List   Diagnosis Date Noted  . Chest pain 06/25/2013  . ADD (attention deficit disorder) 02/23/2013  . Migraine with aura 01/13/2013  . Anxiety   . Allergy    Past Medical History  Diagnosis Date  . Anxiety   . ADD (attention deficit disorder)   . Allergy    No past surgical history on file. History  Substance Use Topics  . Smoking status: Never Smoker   . Smokeless tobacco: Not on file  . Alcohol Use: Yes     Comment: rare   Family History  Problem Relation Age of Onset  . Adopted: Yes   No Known Allergies Current Outpatient Prescriptions on File Prior to Visit  Medication Sig Dispense Refill  . ALTAVERA 0.15-30 MG-MCG tablet TAKE 1 TABLET BY MOUTH ONCE A DAY  28 tablet  1  . fexofenadine-pseudoephedrine (ALLEGRA-D 24 HOUR) 180-240 MG per 24 hr tablet Take 1 tablet by mouth daily.      . fluticasone (FLONASE) 50 MCG/ACT nasal spray PLACE 2 SPRAYS INTO THE NOSE DAILY.  16 g  5  . tiZANidine (ZANAFLEX) 4 MG tablet TAKE 1 TABLET BY MOUTH AT BEDTIME  30 tablet  0   No current facility-administered medications on file prior to visit.    The PMH, PSH, Social History, Family History, Medications, and allergies [facility], and have been updated if relevant.   Review of  Systems See HPI    Objective:   Physical Exam  Nursing note and vitals reviewed. Constitutional: She appears well-developed and well-nourished. No distress.  HENT:  Head: Normocephalic.  Cardiovascular: Normal rate and regular rhythm.   Pulmonary/Chest: Effort normal and breath sounds normal. Chest wall is not dull to percussion. She exhibits tenderness. She exhibits no mass, no laceration, no crepitus, no edema, no deformity and no retraction.    Skin: Skin is warm, dry and intact.   BP 112/68  Pulse 90  Temp(Src) 97.9 F (36.6 C) (Oral)  Ht 5\' 4"  (1.626 m)  Wt 139 lb (63.05 kg)  BMI 23.85 kg/m2  SpO2 98%

## 2013-06-25 NOTE — Assessment & Plan Note (Signed)
Most consistent with costochondritis. I am reassured by neg work up in ER- ruled out MI, neg DVT, neg malignancy or infectious process. Reassurance provided. Discussed course and treatment of costochondritis- see AVS for details. Call or return to clinic prn if these symptoms worsen or fail to improve as anticipated. The patient indicates understanding of these issues and agrees with the plan.

## 2013-06-28 ENCOUNTER — Ambulatory Visit (INDEPENDENT_AMBULATORY_CARE_PROVIDER_SITE_OTHER): Payer: 59 | Admitting: Internal Medicine

## 2013-06-28 ENCOUNTER — Encounter: Payer: Self-pay | Admitting: Internal Medicine

## 2013-06-28 ENCOUNTER — Other Ambulatory Visit (HOSPITAL_COMMUNITY)
Admission: RE | Admit: 2013-06-28 | Discharge: 2013-06-28 | Disposition: A | Discharge status: Home / Self Care | Payer: 59 | Source: Ambulatory Visit | Attending: Internal Medicine | Admitting: Internal Medicine

## 2013-06-28 ENCOUNTER — Encounter: Payer: Self-pay | Admitting: Family Medicine

## 2013-06-28 VITALS — BP 120/76 | HR 70 | Temp 98.2°F | Ht 64.5 in | Wt 139.0 lb

## 2013-06-28 DIAGNOSIS — Z124 Encounter for screening for malignant neoplasm of cervix: Secondary | ICD-10-CM

## 2013-06-28 DIAGNOSIS — Z01419 Encounter for gynecological examination (general) (routine) without abnormal findings: Secondary | ICD-10-CM | POA: Insufficient documentation

## 2013-06-28 DIAGNOSIS — M940 Chondrocostal junction syndrome [Tietze]: Secondary | ICD-10-CM

## 2013-06-28 DIAGNOSIS — Z1151 Encounter for screening for human papillomavirus (HPV): Secondary | ICD-10-CM | POA: Insufficient documentation

## 2013-06-28 DIAGNOSIS — Z0000 Encounter for general adult medical examination without abnormal findings: Secondary | ICD-10-CM

## 2013-06-28 LAB — LDL CHOLESTEROL, DIRECT: LDL DIRECT: 104.6 mg/dL

## 2013-06-28 LAB — CBC
HEMATOCRIT: 40.6 % (ref 36.0–46.0)
HEMOGLOBIN: 13.9 g/dL (ref 12.0–15.0)
MCHC: 34.2 g/dL (ref 30.0–36.0)
MCV: 86.8 fl (ref 78.0–100.0)
PLATELETS: 278 10*3/uL (ref 150.0–400.0)
RBC: 4.68 Mil/uL (ref 3.87–5.11)
RDW: 12.8 % (ref 11.5–14.6)
WBC: 6.5 10*3/uL (ref 4.5–10.5)

## 2013-06-28 LAB — COMPREHENSIVE METABOLIC PANEL
ALT: 13 U/L (ref 0–35)
AST: 18 U/L (ref 0–37)
Albumin: 3.7 g/dL (ref 3.5–5.2)
Alkaline Phosphatase: 38 U/L — ABNORMAL LOW (ref 39–117)
BILIRUBIN TOTAL: 0.4 mg/dL (ref 0.3–1.2)
BUN: 10 mg/dL (ref 6–23)
CO2: 28 mEq/L (ref 19–32)
CREATININE: 0.9 mg/dL (ref 0.4–1.2)
Calcium: 9.6 mg/dL (ref 8.4–10.5)
Chloride: 106 mEq/L (ref 96–112)
GFR: 79.75 mL/min (ref >60.00)
Glucose, Bld: 90 mg/dL (ref 70–99)
Potassium: 4.3 mEq/L (ref 3.5–5.1)
Sodium: 139 mEq/L (ref 135–145)
Total Protein: 6.9 g/dL (ref 6.0–8.3)

## 2013-06-28 LAB — LIPID PANEL
Cholesterol: 185 mg/dL (ref 0–200)
HDL: 44.4 mg/dL (ref >39.00)
TRIGLYCERIDES: 221 mg/dL — AB (ref 0.0–149.0)
Total CHOL/HDL Ratio: 4
VLDL: 44.2 mg/dL — AB (ref 0.0–40.0)

## 2013-06-28 LAB — TSH: TSH: 0.85 u[IU]/mL (ref 0.35–5.50)

## 2013-06-28 MED ORDER — LEVONORGESTREL-ETHINYL ESTRAD 0.15-30 MG-MCG PO TABS: ORAL_TABLET | ORAL | Status: DC

## 2013-06-28 NOTE — Addendum Note (Signed)
Addended by: Lorre MunroeBAITY, Raynie Steinhaus W on: 06/28/2013 02:56 PM   Modules accepted: Orders

## 2013-06-28 NOTE — Progress Notes (Signed)
Pre-visit discussion using our clinic review tool. No additional management support is needed unless otherwise documented below in the visit note.  

## 2013-06-28 NOTE — Addendum Note (Signed)
Addended by: Roena MaladyEVONTENNO, Azaya Goedde Y on: 06/28/2013 02:57 PM   Modules accepted: Orders

## 2013-06-28 NOTE — Patient Instructions (Signed)

## 2013-06-28 NOTE — Progress Notes (Signed)
Subjective:    Patient ID: Joanna Walker, female    DOB: [birth year], 38 y.o.   MRN: [id number]  HPI  Pt presents to the clinic today for her annual exam. She has no concerns today.  Flu: 03/2013 Tetanus: unsure of date LMP: on continuous birth control Pap Smear: 2012 Dentist: biannually Mammogram: yearly 06/2012  Review of Systems      Past Medical History  Diagnosis Date  . Anxiety   . ADD (attention deficit disorder)   . Allergy     Current Outpatient Prescriptions  Medication Sig Dispense Refill  . ALTAVERA 0.15-30 MG-MCG tablet TAKE 1 TABLET BY MOUTH ONCE A DAY  28 tablet  1  . amphetamine-dextroamphetamine (ADDERALL) 30 MG tablet Take 1 tablet (30 mg total) by mouth 2 (two) times daily.  60 tablet  0  . fexofenadine-pseudoephedrine (ALLEGRA-D 24 HOUR) 180-240 MG per 24 hr tablet Take 1 tablet by mouth daily.      . fluticasone (FLONASE) 50 MCG/ACT nasal spray PLACE 2 SPRAYS INTO THE NOSE DAILY.  16 g  5  . ketorolac (TORADOL) 10 MG tablet Take 10 mg by mouth every 8 (eight) hours as needed.      . Multiple Vitamins-Minerals (MULTIVITAMIN PO) Take 1 tablet by mouth daily.      . Omega-3 Fatty Acids (FISH OIL) 1000 MG CAPS Take 1 capsule by mouth 2 (two) times daily.      Marland Kitchen tiZANidine (ZANAFLEX) 4 MG tablet TAKE 1 TABLET BY MOUTH AT BEDTIME  30 tablet  0  . traZODone (DESYREL) 100 MG tablet TAKE 1 TABLET BY MOUTH AT BEDTIME.  30 tablet  1  . vitamin C (ASCORBIC ACID) 500 MG tablet Take 500 mg by mouth 2 (two) times daily.       No current facility-administered medications for this visit.    No Known Allergies  Family History  Problem Relation Age of Onset  . Adopted: Yes    History   Social History  . Marital Status: Legally Separated    Spouse Name: N/A    Number of Children: N/A  . Years of Education: N/A   Occupational History  . Not on file.   Social History Main Topics  . Smoking status: Never Smoker   . Smokeless tobacco: Not on file  .  Alcohol Use: Yes     Comment: rare  . Drug Use: No  . Sexual Activity: Not on file   Other Topics Concern  . Not on file   Social History Narrative  . No narrative on file     Constitutional: Denies fever, malaise, fatigue, headache or abrupt weight changes.  HEENT: Denies eye pain, eye redness, ear pain, ringing in the ears, wax buildup, runny nose, nasal congestion, bloody nose, or sore throat. Respiratory: Denies difficulty breathing, shortness of breath, cough or sputum production.   Cardiovascular: Denies chest pain, chest tightness, palpitations or swelling in the hands or feet.  Gastrointestinal: Pt reports intermittent constipation. Denies abdominal pain, bloating, diarrhea or blood in the stool.  GU: Denies urgency, frequency, pain with urination, burning sensation, blood in urine, odor or discharge. Musculoskeletal: Pt reports chest wall pain. Denies decrease in range of motion, difficulty with gait, or joint pain and swelling.  Skin: Denies redness, rashes, lesions or ulcercations.  Neurological: Denies dizziness, difficulty with memory, difficulty with speech or problems with balance and coordination.   No other specific complaints in a complete review of systems (except as listed in HPI  above).  Objective:   Physical Exam   BP 120/76  Pulse 70  Temp(Src) 98.2 F (36.8 C) (Oral)  Ht 5' 4.5" (1.638 m)  Wt 139 lb (63.05 kg)  BMI 23.50 kg/m2 Wt Readings from Last 3 Encounters:  06/28/13 139 lb (63.05 kg)  06/25/13 139 lb (63.05 kg)  02/23/13 147 lb (66.679 kg)    Constitutional:  Alert, oriented x 4, well developed, well nourished in no apparent distress. Skin: Skin is warm and dry.  No erythema, lesion or ulceration noted. HEENT: Head: normal shape and size; Eyes: sclera white, no icterus, conjunctiva pink, PERRLA and EOMs intact; Ears: Tm's gray and intact, normal light reflex; Nose: mucosa pink and moist, septum midline; Throat/Mouth: Teeth present, , mucosa  pink and moist, no lesions or ulcerations noted. Neck: Normal range of motion. Neck supple, trachea midline. No massses, lumps or thyromegaly present.  Cardiovascular: Normal rate and rhythm. S1,S2 noted.  No murmur, rubs or gallops noted. No JVD or BLE edema. No carotid bruits noted. Pulmonary/Chest: Normal effort and positive vesicular breath sounds. No respiratory distress. No wheezes, rales or ronchi noted.  Abdomen: Soft and nontender. Normal bowel sounds, no bruits noted. No distention or masses noted. Liver, spleen and kidneys non palpable. Genitourinary: Normal female anatomy. Uterus midline, anterior and soft. No CMT or discharge noted. Adenexa non palpable. Breast without lumps or masses.  Musculoskeletal: Normal range of motion. Patient exhibits no effusions.  Neurological: Alert and oriented. Cranial nerves II-XII intact. Coordination normal. +DTRs bilaterally. Psychiatric: She has a normal mood and affect. Behavior is normal. Judgment and thought content normal.    BMET    Component Value Date/Time   NA 136 07/02/2012 1454   K 3.7 07/02/2012 1454   CL 103 07/02/2012 1454   CO2 26 07/02/2012 1454   GLUCOSE 102* 07/02/2012 1454   BUN 11 07/02/2012 1454   CREATININE 0.7 07/02/2012 1454   CALCIUM 9.2 07/02/2012 1454    Lipid Panel     Component Value Date/Time   CHOL 224* 07/02/2012 1454   TRIG 288.0* 07/02/2012 1454   HDL 48.30 07/02/2012 1454   CHOLHDL 5 07/02/2012 1454   VLDL 57.6* 07/02/2012 1454    CBC No results found for this basename: wbc, rbc, hgb, hct, plt, mcv, mch, mchc, rdw, neutrabs, lymphsabs, monoabs, eosabs, basosabs    Hgb A1C No results found for this basename: HGBA1C        Assessment & Plan:   Preventative Health Maintenance:  She already had her flu shot this year She will get her Td today Will obtain screening labwork today Pap smear obtained today  Costochondritis:  Advised her to continue the diclofenac She does report improvement  RTC in  1 year or sooner if needed

## 2013-06-29 ENCOUNTER — Other Ambulatory Visit: Payer: Self-pay | Admitting: *Deleted

## 2013-06-29 ENCOUNTER — Encounter: Payer: Self-pay | Admitting: Family Medicine

## 2013-06-29 MED ORDER — AMPHETAMINE-DEXTROAMPHETAMINE 20 MG PO TABS: 20.0000 mg | ORAL_TABLET | Freq: Three times a day (TID) | ORAL | Status: DC

## 2013-06-29 NOTE — Addendum Note (Signed)
Addended by: Dianne DunARON, Kelee Cunningham M on: 06/29/2013 12:20 PM   Modules accepted: Orders, Medications

## 2013-06-29 NOTE — Telephone Encounter (Signed)
Pt answered through mychart, informing her that Rx is available for pickup at the front desk and she will need a gov't issued photo id for pickup

## 2013-06-29 NOTE — Telephone Encounter (Signed)
Pt requesting medication refill. pls advise 

## 2013-06-30 ENCOUNTER — Encounter: Payer: Self-pay | Admitting: Internal Medicine

## 2013-06-30 MED ORDER — TIZANIDINE HCL 4 MG PO TABS: ORAL_TABLET | ORAL | Status: DC

## 2013-07-06 ENCOUNTER — Encounter: Payer: Self-pay | Admitting: Family Medicine

## 2013-07-06 ENCOUNTER — Ambulatory Visit (INDEPENDENT_AMBULATORY_CARE_PROVIDER_SITE_OTHER): Payer: 59 | Admitting: Family Medicine

## 2013-07-06 VITALS — BP 128/68 | HR 100 | Temp 97.9°F | Ht 64.5 in | Wt 134.2 lb

## 2013-07-06 DIAGNOSIS — F341 Dysthymic disorder: Secondary | ICD-10-CM

## 2013-07-06 DIAGNOSIS — F419 Anxiety disorder, unspecified: Principal | ICD-10-CM

## 2013-07-06 DIAGNOSIS — F329 Major depressive disorder, single episode, unspecified: Secondary | ICD-10-CM

## 2013-07-06 DIAGNOSIS — Z0279 Encounter for issue of other medical certificate: Secondary | ICD-10-CM

## 2013-07-06 DIAGNOSIS — F32A Depression, unspecified: Secondary | ICD-10-CM

## 2013-07-06 MED ORDER — BUPROPION HCL ER (XL) 150 MG PO TB24: 150.0000 mg | ORAL_TABLET | Freq: Every day | ORAL | Status: DC

## 2013-07-06 NOTE — Assessment & Plan Note (Addendum)
Deteriorated. >25 min spent with face to face with patient, >50% counseling and/or coordinating care  Multiple intolerances in past.  Given current symptoms and side effects with SSRIs, will start Wellbutrin 150 mg XL daily. She would also like to go on short term disability for her costochrondritis and depression.

## 2013-07-06 NOTE — Progress Notes (Signed)
Pre-visit discussion using our clinic review tool. No additional management support is needed unless otherwise documented below in the visit note.  

## 2013-07-06 NOTE — Patient Instructions (Signed)
It was good to see you. Let's restart Wellbutrin 150 mg XL daily.  Keep me updated with your symptoms.

## 2013-07-06 NOTE — Progress Notes (Signed)
Subjective:    Patient ID: Joanna Walker, female    DOB: 03-26-1976, 38 y.o.   MRN: [id number]  HPI Very pleasant 38 yo G2P2 here to discuss anxiety.  Depression and anxiety- chronic issue "her entire life."  She weaned herself off of Lexapro.  She "hated the side effects"- decreased sex drive, weight gain.  Has been on others in past, including antipsychotics.  At one point, a psychiatrist thought she was bipolar but she feels she has none of those characteristics . Has stayed up all night but more due to insomnia.  Feels she was not truly manic.  I prescribed buspar but she felt this made her dizzy and did not help.  Tried it for a few months.    Not having panic attacks.  ADD symptoms seem well controlled.   Feels more depressed, more anhedonia.  Hates her job and her boss is very controlling.  Wants to look for another job.  Cannot "function" at work. No SI or HI.  Appetite decreased.  Has lost 5 pounds in a week.  Wt Readings from Last 3 Encounters:  07/06/13 134 lb 4 oz (60.895 kg)  06/28/13 139 lb (63.05 kg)  06/25/13 139 lb (63.05 kg)      Patient Active Problem List   Diagnosis Date Noted  . ADD (attention deficit disorder) 02/23/2013  . Migraine with aura 01/13/2013  . Anxiety and depression   . Allergy    Past Medical History  Diagnosis Date  . Anxiety   . ADD (attention deficit disorder)   . Allergy    No past surgical history on file. History  Substance Use Topics  . Smoking status: Never Smoker   . Smokeless tobacco: Not on file  . Alcohol Use: Yes     Comment: rare   Family History  Problem Relation Age of Onset  . Adopted: Yes   No Known Allergies Current Outpatient Prescriptions on File Prior to Visit  Medication Sig Dispense Refill  . amphetamine-dextroamphetamine (ADDERALL) 20 MG tablet Take 1 tablet (20 mg total) by mouth 3 (three) times daily.  90 tablet  0  . fexofenadine-pseudoephedrine (ALLEGRA-D 24 HOUR) 180-240 MG per 24 hr tablet  Take 1 tablet by mouth daily.      . fluticasone (FLONASE) 50 MCG/ACT nasal spray PLACE 2 SPRAYS INTO THE NOSE DAILY.  16 g  5  . ketorolac (TORADOL) 10 MG tablet Take 10 mg by mouth every 8 (eight) hours as needed.      Marland Kitchen. levonorgestrel-ethinyl estradiol (ALTAVERA) 0.15-30 MG-MCG tablet TAKE 1 TABLET BY MOUTH ONCE A DAY  28 tablet  11  . Multiple Vitamins-Minerals (MULTIVITAMIN PO) Take 1 tablet by mouth daily.      . Omega-3 Fatty Acids (FISH OIL) 1000 MG CAPS Take 1 capsule by mouth 2 (two) times daily.      Marland Kitchen. tiZANidine (ZANAFLEX) 4 MG tablet TAKE 1 TABLET BY MOUTH AT BEDTIME  30 tablet  0  . traZODone (DESYREL) 100 MG tablet TAKE 1 TABLET BY MOUTH AT BEDTIME.  30 tablet  1  . vitamin C (ASCORBIC ACID) 500 MG tablet Take 500 mg by mouth 2 (two) times daily.       No current facility-administered medications on file prior to visit.   The PMH, PSH, Social History, Family History, Medications, and allergies [facility], and have been updated if relevant.    Review of Systems    See HPI  Objective:   Physical  Exam BP 128/68  Pulse 100  Temp(Src) 97.9 F (36.6 C) (Oral)  Ht 5' 4.5" (1.638 m)  Wt 134 lb 4 oz (60.895 kg)  BMI 22.70 kg/m2  SpO2 99%  General:  Well-developed,well-nourished,in no acute distress; alert,appropriate and cooperative throughout examination Head:  normocephalic and atraumatic.   Psych:  Cognition and judgment appear intact. Alert and cooperative with normal attention span and concentration. No apparent delusions, illusions, hallucinations      Assessment & Plan:

## 2013-07-13 ENCOUNTER — Telehealth: Payer: Self-pay | Admitting: Family Medicine

## 2013-07-13 NOTE — Telephone Encounter (Signed)
Pt dropped off FMLA forms to be completed.  I have completed most of the forms except for a flagged section, which will need Dr. Elmer SowAron's review (I wasn't sure about the dates).  Once she has reviewed/signed the forms please return to me.  I have placed them in an orange folder and the folder is on your desk.  Thank you.

## 2013-07-14 ENCOUNTER — Encounter: Payer: Self-pay | Admitting: Family Medicine

## 2013-07-14 NOTE — Telephone Encounter (Signed)
i received the paperwork and have placed it in Dr Elmer SowAron's inbox for completion. I will bring it back to you once it has been signed and dates are verified

## 2013-07-16 NOTE — Telephone Encounter (Signed)
Left mssg notifying pt forms are complete and ready for p/u @ front office.

## 2013-07-27 ENCOUNTER — Other Ambulatory Visit: Payer: Self-pay | Admitting: Family Medicine

## 2013-07-28 ENCOUNTER — Other Ambulatory Visit: Payer: Self-pay

## 2013-07-28 NOTE — Telephone Encounter (Signed)
Pt left v/m requesting rx adderall. Call when ready for pick up. Pt needs to pick up by Fri 07/30/13.

## 2013-07-29 MED ORDER — AMPHETAMINE-DEXTROAMPHETAMINE 20 MG PO TABS: 20.0000 mg | ORAL_TABLET | Freq: Three times a day (TID) | ORAL | Status: DC

## 2013-07-29 NOTE — Telephone Encounter (Signed)
Spoke to pt and informed her Rx is available for pickup at the front desk; pt informed gov't issued photo id required for pickup.  

## 2013-07-30 ENCOUNTER — Encounter: Payer: Self-pay | Admitting: Radiology

## 2013-08-12 ENCOUNTER — Ambulatory Visit: Payer: 59 | Admitting: Family Medicine

## 2013-08-16 ENCOUNTER — Ambulatory Visit (INDEPENDENT_AMBULATORY_CARE_PROVIDER_SITE_OTHER): Payer: 59 | Admitting: Family Medicine

## 2013-08-16 ENCOUNTER — Encounter: Payer: Self-pay | Admitting: Family Medicine

## 2013-08-16 VITALS — BP 124/78 | HR 104 | Temp 98.0°F | Wt 132.8 lb

## 2013-08-16 DIAGNOSIS — F988 Other specified behavioral and emotional disorders with onset usually occurring in childhood and adolescence: Secondary | ICD-10-CM

## 2013-08-16 DIAGNOSIS — R825 Elevated urine levels of drugs, medicaments and biological substances: Secondary | ICD-10-CM

## 2013-08-16 DIAGNOSIS — R892 Abnormal level of other drugs, medicaments and biological substances in specimens from other organs, systems and tissues: Secondary | ICD-10-CM

## 2013-08-16 MED ORDER — AMPHETAMINE-DEXTROAMPHET ER 30 MG PO CP24: 30.0000 mg | ORAL_CAPSULE | ORAL | Status: DC

## 2013-08-16 NOTE — Progress Notes (Signed)
Subjective:    Patient ID: Joanna Walker, female    DOB: [birth year], 38 y.o.   MRN: [id number]  HPI Very pleasant 38 yo here to discuss ADHD and abnormal drug screen.  Urine drug screen neg for adderall but she picks it up every month.  Per pt, UDS was on day she came to pick up her rx and had lost her purse at end of January.  Therefore, she would not have had any adderral in over 2 weeks- see Epic phone note.  Also feels current dose of adderall not working well.  Urine was also positive for benzo that I'm not prescribing.  Pt states another doctor gave it to her- looked up in  database and was given benzo by DDS.      Patient Active Problem List   Diagnosis Date Noted  . Positive urine drug screen 08/16/2013  . ADD (attention deficit disorder) 02/23/2013  . Migraine with aura 01/13/2013  . Anxiety and depression   . Allergy    Past Medical History  Diagnosis Date  . Anxiety   . ADD (attention deficit disorder)   . Allergy    No past surgical history on file. History  Substance Use Topics  . Smoking status: Never Smoker   . Smokeless tobacco: Not on file  . Alcohol Use: Yes     Comment: rare   Family History  Problem Relation Age of Onset  . Adopted: Yes   No Known Allergies Current Outpatient Prescriptions on File Prior to Visit  Medication Sig Dispense Refill  . buPROPion (WELLBUTRIN XL) 150 MG 24 hr tablet Take 1 tablet (150 mg total) by mouth daily.  30 tablet  2  . fluticasone (FLONASE) 50 MCG/ACT nasal spray PLACE 2 SPRAYS INTO THE NOSE DAILY.  16 g  5  . levonorgestrel-ethinyl estradiol (ALTAVERA) 0.15-30 MG-MCG tablet TAKE 1 TABLET BY MOUTH ONCE A DAY  28 tablet  11  . Multiple Vitamins-Minerals (MULTIVITAMIN PO) Take 1 tablet by mouth daily.      . Omega-3 Fatty Acids (FISH OIL) 1000 MG CAPS Take 1 capsule by mouth 2 (two) times daily.      Marland Kitchen. tiZANidine (ZANAFLEX) 4 MG tablet TAKE 1 TABLET BY MOUTH AT BEDTIME  30 tablet  0  . traZODone (DESYREL)  100 MG tablet TAKE 1 TABLET BY MOUTH AT BEDTIME.  30 tablet  1  . vitamin C (ASCORBIC ACID) 500 MG tablet Take 500 mg by mouth 2 (two) times daily.      . fexofenadine-pseudoephedrine (ALLEGRA-D 24 HOUR) 180-240 MG per 24 hr tablet Take 1 tablet by mouth daily.      Marland Kitchen. ketorolac (TORADOL) 10 MG tablet Take 10 mg by mouth every 8 (eight) hours as needed.       No current facility-administered medications on file prior to visit.   The PMH, PSH, Social History, Family History, Medications, and allergies [facility], and have been updated if relevant.    Review of Systems    See HPI  Objective:   Physical Exam BP 124/78  Pulse 104  Temp(Src) 98 F (36.7 C) (Oral)  Wt 132 lb 12 oz (60.215 kg)  SpO2 97%  General:  Well-developed,well-nourished,in no acute distress; alert,appropriate and cooperative throughout examination Head:  normocephalic and atraumatic.   Psych:  Cognition and judgment appear intact. Alert and cooperative with normal attention span and concentration. No apparent delusions, illusions, hallucinations      Assessment & Plan:

## 2013-08-16 NOTE — Assessment & Plan Note (Signed)
Agree that it does fall in right time frame as patient describes that UDS may be neg.  Rx refilled today and advised that if this occurs at next drug screen, I will no longer refill. The patient indicates understanding of these issues and agrees with the plan.

## 2013-08-16 NOTE — Progress Notes (Signed)
Pre visit review using our clinic review tool, if applicable. No additional management support is needed unless otherwise documented below in the visit note. 

## 2013-08-16 NOTE — Patient Instructions (Signed)
Good to see you. Next time you come in for prescription, you will need a urine drug screen.

## 2013-08-24 ENCOUNTER — Other Ambulatory Visit: Payer: Self-pay | Admitting: *Deleted

## 2013-08-24 ENCOUNTER — Other Ambulatory Visit: Payer: Self-pay | Admitting: Family Medicine

## 2013-08-24 ENCOUNTER — Telehealth: Payer: Self-pay

## 2013-08-24 MED ORDER — AMPHETAMINE-DEXTROAMPHETAMINE 30 MG PO TABS: 30.0000 mg | ORAL_TABLET | Freq: Every day | ORAL | Status: DC

## 2013-08-24 MED ORDER — AMPHETAMINE-DEXTROAMPHET ER 30 MG PO CP24: 30.0000 mg | ORAL_CAPSULE | ORAL | Status: DC

## 2013-08-24 MED ORDER — AMPHETAMINE-DEXTROAMPHETAMINE 30 MG PO TABS: 30.0000 mg | ORAL_TABLET | Freq: Three times a day (TID) | ORAL | Status: DC

## 2013-08-24 NOTE — Telephone Encounter (Signed)
I apologize.   Ok to increase to requested dose.  Please print out and put in my box for signature.

## 2013-08-24 NOTE — Telephone Encounter (Signed)
Pt left v/m; pt was seen 08/16/13 and pt requested increase in adderall; pt was getting adderall 20 mg three times a day and pt thought was going to get adderall 30 mg taking three times a day.Please advise.pt request cb.

## 2013-08-24 NOTE — Telephone Encounter (Signed)
Spoke to pt and informed her Rx is available for pickup at the front desk; informed a gov't issued photo id required for pickup 

## 2013-08-24 NOTE — Telephone Encounter (Signed)
Rx printed and awaiting signature.   

## 2013-08-27 ENCOUNTER — Encounter: Payer: Self-pay | Admitting: Family Medicine

## 2013-08-30 ENCOUNTER — Encounter: Payer: Self-pay | Admitting: Family Medicine

## 2013-09-20 ENCOUNTER — Other Ambulatory Visit: Payer: Self-pay | Admitting: Family Medicine

## 2013-09-27 ENCOUNTER — Other Ambulatory Visit: Payer: Self-pay

## 2013-09-27 MED ORDER — AMPHETAMINE-DEXTROAMPHETAMINE 30 MG PO TABS: 30.0000 mg | ORAL_TABLET | Freq: Three times a day (TID) | ORAL | Status: DC

## 2013-09-27 NOTE — Telephone Encounter (Signed)
Lm on pts vm informing her Rx will be available for pickup at 4/14 appt at the front desk

## 2013-09-27 NOTE — Telephone Encounter (Signed)
Pt left v/m requesting rx for Adderall. Call when ready for pick up. Pt has appt scheduled with Dr Dayton MartesAron on 09/28/13 at 11:45 am and wants to pick up rx then.

## 2013-09-28 ENCOUNTER — Ambulatory Visit (INDEPENDENT_AMBULATORY_CARE_PROVIDER_SITE_OTHER): Payer: BC Managed Care – PPO | Admitting: Family Medicine

## 2013-09-28 ENCOUNTER — Encounter: Payer: Self-pay | Admitting: Family Medicine

## 2013-09-28 VITALS — BP 114/70 | HR 80 | Temp 98.1°F | Wt 130.8 lb

## 2013-09-28 DIAGNOSIS — J309 Allergic rhinitis, unspecified: Secondary | ICD-10-CM

## 2013-09-28 MED ORDER — FLUTICASONE PROPIONATE 50 MCG/ACT NA SUSP: NASAL | Status: DC

## 2013-09-28 NOTE — Patient Instructions (Addendum)
Good to see you. Let's restart flonase.  You could add Zyrtec or Benadryl at bedtime along with allegra.

## 2013-09-28 NOTE — Progress Notes (Signed)
   Subjective:   Patient ID: Joanna SpittleJennifer D Mullis, female    DOB: 1975-09-06, 38 y.o.   MRN: [id number]  Joanna SpittleJennifer D Mullis is a pleasant 38 y.o. year old female who presents to clinic today with Allergies  on 09/28/2013  HPI: H/o allergic rhinitis.  Very allergic to pollen.  Even with allegra d, symptoms worsening.  Constantly itchy, watery eyes, clear drainage from her sinuses.  Sneezing.  No fevers or chills.  No cough or SOB.  Patient Active Problem List   Diagnosis Date Noted  . Allergic rhinitis 09/28/2013  . Positive urine drug screen 08/16/2013  . ADD (attention deficit disorder) 02/23/2013  . Migraine with aura 01/13/2013  . Anxiety and depression   . Allergy    Past Medical History  Diagnosis Date  . Anxiety   . ADD (attention deficit disorder)   . Allergy    No past surgical history on file. History  Substance Use Topics  . Smoking status: Never Smoker   . Smokeless tobacco: Not on file  . Alcohol Use: Yes     Comment: rare   Family History  Problem Relation Age of Onset  . Adopted: Yes   No Known Allergies Current Outpatient Prescriptions on File Prior to Visit  Medication Sig Dispense Refill  . amphetamine-dextroamphetamine (ADDERALL) 30 MG tablet Take 1 tablet (30 mg total) by mouth 3 (three) times daily.  90 tablet  0  . buPROPion (WELLBUTRIN XL) 150 MG 24 hr tablet Take 1 tablet (150 mg total) by mouth daily.  30 tablet  2  . fexofenadine-pseudoephedrine (ALLEGRA-D 24 HOUR) 180-240 MG per 24 hr tablet Take 1 tablet by mouth daily.      Marland Kitchen. ketorolac (TORADOL) 10 MG tablet Take 10 mg by mouth every 8 (eight) hours as needed.      Marland Kitchen. levonorgestrel-ethinyl estradiol (ALTAVERA) 0.15-30 MG-MCG tablet TAKE 1 TABLET BY MOUTH ONCE A DAY  28 tablet  11  . Multiple Vitamins-Minerals (MULTIVITAMIN PO) Take 1 tablet by mouth daily.      . Omega-3 Fatty Acids (FISH OIL) 1000 MG CAPS Take 1 capsule by mouth 2 (two) times daily.      Marland Kitchen. tiZANidine (ZANAFLEX) 4 MG tablet TAKE 1  TABLET BY MOUTH AT BEDTIME  30 tablet  0  . traZODone (DESYREL) 100 MG tablet TAKE 1 TABLET BY MOUTH AT BEDTIME.  30 tablet  1  . vitamin C (ASCORBIC ACID) 500 MG tablet Take 500 mg by mouth 2 (two) times daily.       No current facility-administered medications on file prior to visit.   The PMH, PSH, Social History, Family History, Medications, and allergies [facility], and have been updated if relevant.   Review of Systems    See HPI Objective:    BP 114/70  Pulse 80  Temp(Src) 98.1 F (36.7 C) (Oral)  Wt 130 lb 12 oz (59.308 kg)  SpO2 97%   Physical Exam  Gen:  Alert, pleasant, NAD HEENT; +conjunctival injection, clear rhinorrhea, sinuses NTTP Resp:  CTA bilaterally Psych:  Good eye contact, not anxious or depressed appearing      Assessment & Plan:   Allergic rhinitis No Follow-up on file.

## 2013-09-28 NOTE — Progress Notes (Signed)
Pre visit review using our clinic review tool, if applicable. No additional management support is needed unless otherwise documented below in the visit note. 

## 2013-09-28 NOTE — Assessment & Plan Note (Signed)
Deteriorated. Add flonase. May need to add benadryl or zytrec at bedtime. Call or return to clinic prn if these symptoms worsen or fail to improve as anticipated. The patient indicates understanding of these issues and agrees with the plan.

## 2013-10-19 ENCOUNTER — Other Ambulatory Visit: Payer: Self-pay | Admitting: Family Medicine

## 2013-10-19 ENCOUNTER — Telehealth: Payer: Self-pay

## 2013-10-19 NOTE — Telephone Encounter (Signed)
Received refill request electronically. Last refill 08/24/13 #30/1, last office visit 09/28/13. Is it okay to refill medication?

## 2013-10-19 NOTE — Telephone Encounter (Signed)
Pt left v/m; was diagnosed with costochondritis in 06/2013. Pt said had a lot of testing. Pt said restarted with pain on rt side on 10/18/13 just as it did in Jan. Pt wants to know if med can be sent to pharmacy or does pt need to be seen? [facility]

## 2013-10-19 NOTE — Telephone Encounter (Signed)
She would need to be seen

## 2013-10-19 NOTE — Telephone Encounter (Signed)
Electronic refill request, please advise  

## 2013-10-19 NOTE — Telephone Encounter (Signed)
appt scheduled tomorrow at 9:00am

## 2013-10-20 ENCOUNTER — Ambulatory Visit (INDEPENDENT_AMBULATORY_CARE_PROVIDER_SITE_OTHER)
Admission: RE | Admit: 2013-10-20 | Discharge: 2013-10-20 | Disposition: A | Discharge status: Home / Self Care | Payer: BC Managed Care – PPO | Source: Ambulatory Visit | Attending: Family Medicine | Admitting: Family Medicine

## 2013-10-20 ENCOUNTER — Encounter: Payer: Self-pay | Admitting: Family Medicine

## 2013-10-20 ENCOUNTER — Ambulatory Visit (INDEPENDENT_AMBULATORY_CARE_PROVIDER_SITE_OTHER): Payer: BC Managed Care – PPO | Admitting: Family Medicine

## 2013-10-20 VITALS — BP 112/80 | HR 88 | Temp 98.0°F | Wt 130.5 lb

## 2013-10-20 DIAGNOSIS — R079 Chest pain, unspecified: Secondary | ICD-10-CM

## 2013-10-20 DIAGNOSIS — N644 Mastodynia: Secondary | ICD-10-CM

## 2013-10-20 LAB — D-DIMER, QUANTITATIVE: D-Dimer, Quant: 0.33 ug/mL-FEU (ref 0.00–0.48)

## 2013-10-20 MED ORDER — KETOROLAC TROMETHAMINE 10 MG PO TABS: 10.0000 mg | ORAL_TABLET | Freq: Three times a day (TID) | ORAL | Status: DC | PRN

## 2013-10-20 MED ORDER — AMPHETAMINE-DEXTROAMPHETAMINE 30 MG PO TABS: 30.0000 mg | ORAL_TABLET | Freq: Three times a day (TID) | ORAL | Status: DC

## 2013-10-20 MED ORDER — KETOROLAC TROMETHAMINE 30 MG/ML IM SOLN: 30.0000 mg | Freq: Once | INTRAMUSCULAR | Status: DC

## 2013-10-20 MED ORDER — KETOROLAC TROMETHAMINE 30 MG/ML IJ SOLN
30.0000 mg | Freq: Once | INTRAMUSCULAR | Status: AC
  Administered 2013-10-20: 30 mg via INTRAMUSCULAR

## 2013-10-20 NOTE — Progress Notes (Signed)
Subjective:    Patient ID: Joanna SpittleJennifer D Mullis, female    DOB: 09-09-1975, 38 y.o.   MRN: [id number]  HPI  38 yo here for recurrent chest pain.  Was seen for exact same pain (in same location) in 06/2013.  Notes reviewed again.  [facility] for right sided chest pain- pain reproducible and worse with deep inspirations, cough or arm movement.  neg d dimer, neg trop, EKG NSR, neg CXR. CTA of chest negative.  Pain went away eventually with toradol.  3 days ago, same pain reappeared.  No injury.  No known trauma.  Did not lift anything heavy.  Pain is constant but worse with deep inspirations. Patient Active Problem List   Diagnosis Date Noted  . Allergic rhinitis 09/28/2013  . Positive urine drug screen 08/16/2013  . ADD (attention deficit disorder) 02/23/2013  . Migraine with aura 01/13/2013  . Anxiety and depression   . Allergy    Past Medical History  Diagnosis Date  . Anxiety   . ADD (attention deficit disorder)   . Allergy    No past surgical history on file. History  Substance Use Topics  . Smoking status: Never Smoker   . Smokeless tobacco: Not on file  . Alcohol Use: Yes     Comment: rare   Family History  Problem Relation Age of Onset  . Adopted: Yes   No Known Allergies Current Outpatient Prescriptions on File Prior to Visit  Medication Sig Dispense Refill  . amphetamine-dextroamphetamine (ADDERALL) 30 MG tablet Take 1 tablet (30 mg total) by mouth 3 (three) times daily.  90 tablet  0  . buPROPion (WELLBUTRIN XL) 150 MG 24 hr tablet Take 1 tablet (150 mg total) by mouth daily.  30 tablet  2  . fexofenadine-pseudoephedrine (ALLEGRA-D 24 HOUR) 180-240 MG per 24 hr tablet Take 1 tablet by mouth daily.      . fluticasone (FLONASE) 50 MCG/ACT nasal spray PLACE 2 SPRAYS INTO THE NOSE DAILY.  16 g  5  . ketorolac (TORADOL) 10 MG tablet Take 10 mg by mouth every 8 (eight) hours as needed.      Marland Kitchen. levonorgestrel-ethinyl estradiol (ALTAVERA) 0.15-30 MG-MCG  tablet TAKE 1 TABLET BY MOUTH ONCE A DAY  28 tablet  11  . Multiple Vitamins-Minerals (MULTIVITAMIN PO) Take 1 tablet by mouth daily.      . Omega-3 Fatty Acids (FISH OIL) 1000 MG CAPS Take 1 capsule by mouth 2 (two) times daily.      Marland Kitchen. tiZANidine (ZANAFLEX) 4 MG tablet TAKE 1 TABLET BY MOUTH AT BEDTIME  30 tablet  0  . traZODone (DESYREL) 100 MG tablet TAKE 1 TABLET BY MOUTH AT BEDTIME.  30 tablet  1  . vitamin C (ASCORBIC ACID) 500 MG tablet Take 500 mg by mouth 2 (two) times daily.       No current facility-administered medications on file prior to visit.    The PMH, PSH, Social History, Family History, Medications, and allergies [facility], and have been updated if relevant.   Review of Systems See HPI    Objective:   Physical Exam  Nursing note and vitals reviewed. Constitutional: She appears well-developed and well-nourished. No distress.  HENT:  Head: Normocephalic.  Cardiovascular: Normal rate and regular rhythm.   Pulmonary/Chest: Effort normal and breath sounds normal. Chest wall is not dull to percussion. She exhibits tenderness. She exhibits no mass, no laceration, no crepitus, no edema, no deformity and no retraction.  Skin: Skin is warm, dry and intact.   BP 112/80  Pulse 88  Temp(Src) 98 F (36.7 C) (Oral)  Wt 130 lb 8 oz (59.194 kg)  SpO2 99%

## 2013-10-20 NOTE — Assessment & Plan Note (Signed)
Does seem consistent again with costochondritis but it is a little unusual in presentation. Will get stat D dimer, CXR. Also diag mammogram for right breast pain. IM toradol and continue oral toradol. Call or return to clinic prn if these symptoms worsen or fail to improve as anticipated. The patient indicates understanding of these issues and agrees with the plan.

## 2013-10-20 NOTE — Patient Instructions (Signed)
Good to see you. We will call you with your lab results and xray results today.  Please stop by to see Joanna Walker on your way out to set up your mammogram.  Take toradol as directed.

## 2013-10-20 NOTE — Progress Notes (Signed)
Pre visit review using our clinic review tool, if applicable. No additional management support is needed unless otherwise documented below in the visit note. 

## 2013-11-03 ENCOUNTER — Ambulatory Visit
Admission: RE | Admit: 2013-11-03 | Discharge: 2013-11-03 | Disposition: A | Discharge status: Home / Self Care | Payer: BC Managed Care – PPO | Source: Ambulatory Visit | Attending: Family Medicine | Admitting: Family Medicine

## 2013-11-03 DIAGNOSIS — N644 Mastodynia: Secondary | ICD-10-CM

## 2013-11-15 ENCOUNTER — Other Ambulatory Visit: Payer: Self-pay | Admitting: Family Medicine

## 2013-11-15 NOTE — Telephone Encounter (Signed)
Pt left v/m requesting status of tizanadine; pt is going out of town; spoke with pharmacy and rx is ready for pick up. Pt advised.

## 2013-11-25 ENCOUNTER — Other Ambulatory Visit: Payer: Self-pay

## 2013-11-25 MED ORDER — AMPHETAMINE-DEXTROAMPHETAMINE 30 MG PO TABS: 30.0000 mg | ORAL_TABLET | Freq: Three times a day (TID) | ORAL | Status: DC

## 2013-11-25 NOTE — Telephone Encounter (Signed)
Pt left v/m requesting rx for Adderall. Call when ready for pick up.  

## 2013-11-25 NOTE — Telephone Encounter (Signed)
Spoke to pt and informed her Rx is available for pick up at the front desk 

## 2013-11-26 ENCOUNTER — Encounter: Payer: Self-pay | Admitting: Family Medicine

## 2013-12-01 ENCOUNTER — Ambulatory Visit: Payer: BC Managed Care – PPO | Admitting: Internal Medicine

## 2013-12-10 ENCOUNTER — Other Ambulatory Visit: Payer: Self-pay | Admitting: *Deleted

## 2013-12-10 MED ORDER — AMPHETAMINE-DEXTROAMPHETAMINE 30 MG PO TABS: 30.0000 mg | ORAL_TABLET | Freq: Two times a day (BID) | ORAL | Status: DC

## 2013-12-10 NOTE — Telephone Encounter (Signed)
Lm on pts vm requesting a call back 

## 2013-12-10 NOTE — Telephone Encounter (Signed)
Fax received from pharmacy indicating pts insurance will only cover 1 tab BID instead of TID. Sig changes in below request and is requiring authorization. Pls advise

## 2013-12-12 ENCOUNTER — Other Ambulatory Visit: Payer: Self-pay | Admitting: Family Medicine

## 2013-12-13 NOTE — Telephone Encounter (Signed)
Lm on pts vm requesting a call back 

## 2013-12-14 ENCOUNTER — Telehealth: Payer: Self-pay | Admitting: Family Medicine

## 2013-12-14 NOTE — Telephone Encounter (Signed)
Do you want me to discharge the patient from the practice?  Or are you still willing to see the patient and just not prescribe controlled substances?

## 2013-12-14 NOTE — Telephone Encounter (Signed)
Discussed son's drug screen with pt.  UDS neg for adderall but he picks up 60 tablets a month. Joanna Walker tells me he takes breaks on the weekend and actually only takes 1 tablet a day when he does take it.  His mother keeps his pills- he cannot access them.  Joanna Walker, his mother, is very upset "why are you calling me a drug addict?  I don't sell pills." I explained to her that this is not what I am saying.  Ok to take breaks or change how often he takes his medication but then he should NOT be picking up 60 tablets a month.  She said "I will not be made to feel like a drug addict and I am finding a new doctor."  I apologized she feels this way but I have to follow up on this and manage these highly regulated medications properly.  I told her I would send this to our office manager.  We will no longer be prescribing adderrall to [name] since he is finding a new provider and UDS was neg.  He should have a lot of left over tablets at home (over 100 ) if he has been taking it like he says, so he should have plenty to find a new provider.

## 2013-12-14 NOTE — Telephone Encounter (Signed)
Spoke to pt who states that she kept her original Rx for taking meds TID. She states that her insurance covers #60 and she pays for the remaining #30 out of pocket

## 2013-12-14 NOTE — Telephone Encounter (Signed)
I'm pretty sure that she wants to leave the practice so you do not need to discharge her.  You may need to call her to see how we can facilitate her finding another provider if that is what she is choosing to do.

## 2013-12-15 ENCOUNTER — Other Ambulatory Visit: Payer: Self-pay | Admitting: Family Medicine

## 2013-12-20 ENCOUNTER — Encounter: Payer: Self-pay | Admitting: Family Medicine

## 2013-12-28 ENCOUNTER — Other Ambulatory Visit: Payer: Self-pay

## 2013-12-28 NOTE — Telephone Encounter (Signed)
Pt request rx adderall. Call when ready for pick up. Pt said she has been taking one tab twice a day and request # 60. Pt said she did not get 12/10/13 rx. I [facility]ennifer [facility] and pt last got Adderall 30 mg on 12/10/13; the Adderall rx was dated 11/25/13 and picked up on 12/10/13 with quantity # 90. When I asked pt if she was going out of town and that was reason for requesting Adderall early pt said no and she was not requesting rx early.Please advise.

## 2013-12-28 NOTE — Telephone Encounter (Signed)
Rx denied- too soon for refill.

## 2013-12-29 NOTE — Telephone Encounter (Signed)
Spoke to pt and informed her that her refill date is based on when she actually got the Rx filled, not the date it was written. Pt states that for all future refills, she is wanting #60, which her insurance will cover, versus #90, where insurance pays for #60 and she pays for remaining amt

## 2014-01-10 ENCOUNTER — Other Ambulatory Visit: Payer: Self-pay | Admitting: Family Medicine

## 2014-01-11 ENCOUNTER — Telehealth: Payer: Self-pay

## 2014-01-11 NOTE — Telephone Encounter (Signed)
Pt left v/m requesting refill; unable to understand name of med for refill (message was breaking up). Left v/m requesting pt to cb.

## 2014-01-12 ENCOUNTER — Encounter: Payer: Self-pay | Admitting: Family Medicine

## 2014-01-12 ENCOUNTER — Other Ambulatory Visit: Payer: Self-pay | Admitting: Family Medicine

## 2014-01-12 MED ORDER — AMPHETAMINE-DEXTROAMPHETAMINE 30 MG PO TABS: 30.0000 mg | ORAL_TABLET | Freq: Two times a day (BID) | ORAL | Status: DC

## 2014-01-12 NOTE — Telephone Encounter (Signed)
Spoke to pt and informed her Rx is available for pickup.  

## 2014-01-12 NOTE — Telephone Encounter (Signed)
Pt left vm requesting Adderall refill, she would like to pick up today because she is going out of town.  Last filled 12/10/13.

## 2014-01-14 NOTE — Telephone Encounter (Signed)
Pt called back on 01/12/14 and refill done.

## 2014-02-02 ENCOUNTER — Encounter: Payer: Self-pay | Admitting: Family Medicine

## 2014-02-03 ENCOUNTER — Encounter: Payer: Self-pay | Admitting: Family Medicine

## 2014-02-03 ENCOUNTER — Ambulatory Visit (INDEPENDENT_AMBULATORY_CARE_PROVIDER_SITE_OTHER): Payer: BC Managed Care – PPO | Admitting: Family Medicine

## 2014-02-03 VITALS — BP 128/70 | HR 89 | Temp 98.3°F | Wt 137.5 lb

## 2014-02-03 DIAGNOSIS — J069 Acute upper respiratory infection, unspecified: Secondary | ICD-10-CM

## 2014-02-03 MED ORDER — AMOXICILLIN-POT CLAVULANATE 875-125 MG PO TABS: 1.0000 | ORAL_TABLET | Freq: Two times a day (BID) | ORAL | Status: AC

## 2014-02-03 MED ORDER — FLUCONAZOLE 150 MG PO TABS: 150.0000 mg | ORAL_TABLET | Freq: Once | ORAL | Status: AC

## 2014-02-03 NOTE — Patient Instructions (Signed)
Great to see you. This is likely a virus at this point. If your symptoms progress, go ahead and take your Augmentin as prescribed.

## 2014-02-03 NOTE — Progress Notes (Signed)
Pre visit review using our clinic review tool, if applicable. No additional management support is needed unless otherwise documented below in the visit note. 

## 2014-02-03 NOTE — Progress Notes (Signed)
SUBJECTIVE:  Joanna Walker is a 38 y.o. female who complains of coryza, congestion, sneezing and sore throat for 4 days. She denies a history of anorexia and chest pain and denies a history of asthma. Patient denies smoke cigarettes.   Current Outpatient Prescriptions on File Prior to Visit  Medication Sig Dispense Refill  . amphetamine-dextroamphetamine (ADDERALL) 30 MG tablet Take 1 tablet (30 mg total) by mouth 2 (two) times daily.  60 tablet  0  . buPROPion (WELLBUTRIN XL) 150 MG 24 hr tablet Take 1 tablet (150 mg total) by mouth daily.  30 tablet  2  . fexofenadine-pseudoephedrine (ALLEGRA-D 24 HOUR) 180-240 MG per 24 hr tablet Take 1 tablet by mouth daily.      . fluticasone (FLONASE) 50 MCG/ACT nasal spray PLACE 2 SPRAYS INTO THE NOSE DAILY.  16 g  5  . ketorolac (TORADOL) 10 MG tablet Take 1 tablet (10 mg total) by mouth every 8 (eight) hours as needed.  30 tablet  0  . levonorgestrel-ethinyl estradiol (ALTAVERA) 0.15-30 MG-MCG tablet TAKE 1 TABLET BY MOUTH ONCE A DAY  28 tablet  11  . Multiple Vitamins-Minerals (MULTIVITAMIN PO) Take 1 tablet by mouth daily.      . Omega-3 Fatty Acids (FISH OIL) 1000 MG CAPS Take 1 capsule by mouth 2 (two) times daily.      Marland Kitchen tiZANidine (ZANAFLEX) 4 MG tablet TAKE 1 TABLET BY MOUTH AT BEDTIME  30 tablet  0  . traZODone (DESYREL) 100 MG tablet TAKE 1 TABLET BY MOUTH AT BEDTIME.  30 tablet  1  . vitamin C (ASCORBIC ACID) 500 MG tablet Take 500 mg by mouth 2 (two) times daily.       No current facility-administered medications on file prior to visit.    No Known Allergies  Past Medical History  Diagnosis Date  . Anxiety   . ADD (attention deficit disorder)   . Allergy     No past surgical history on file.  Family History  Problem Relation Age of Onset  . Adopted: Yes    History   Social History  . Marital Status: Legally Separated    Spouse Name: N/A    Number of Children: N/A  . Years of Education: N/A   Occupational History  .  Not on file.   Social History Main Topics  . Smoking status: Never Smoker   . Smokeless tobacco: Not on file  . Alcohol Use: Yes     Comment: rare  . Drug Use: No  . Sexual Activity: Not on file   Other Topics Concern  . Not on file   Social History Narrative  . No narrative on file   The PMH, PSH, Social History, Family History, Medications, and allergies have been reviewed in [facility], and have been updated if relevant.  OBJECTIVE: BP 128/70  Pulse 89  Temp(Src) 98.3 F (36.8 C) (Oral)  Wt 137 lb 8 oz (62.37 kg)  SpO2 97%  She appears well, vital signs are as noted. Ears normal.  Throat and pharynx normal.  Neck supple. No adenopathy in the neck. Nose is congested. Sinuses non tender. The chest is clear, without wheezes or rales.  ASSESSMENT:  viral upper respiratory illness  PLAN: Symptomatic therapy suggested: push fluids, rest and return office visit prn if symptoms persist or worsen. Lack of antibiotic effectiveness discussed with her.  Given hard rx for Augmentin to take to pharmacy if symptoms do not resolve in next 5-7 days as expected or  if they progress. Call or return to clinic prn if these symptoms worsen or fail to improve as anticipated.

## 2014-02-04 ENCOUNTER — Telehealth: Payer: Self-pay

## 2014-02-04 MED ORDER — HYDROCODONE-HOMATROPINE 5-1.5 MG/5ML PO SYRP: 5.0000 mL | ORAL_SOLUTION | Freq: Three times a day (TID) | ORAL | Status: DC | PRN

## 2014-02-04 NOTE — Telephone Encounter (Signed)
Pt left v/m; pt was seen 02/03/14 and was offered cough med; pt declined; pt slept a lot last night and this morning and request med for cough [facility]. Pt request cb.

## 2014-02-04 NOTE — Telephone Encounter (Signed)
Spoke to pt and informed her Rx is available for pickup.  

## 2014-02-04 NOTE — Telephone Encounter (Signed)
Rx printed

## 2014-02-06 ENCOUNTER — Other Ambulatory Visit: Payer: Self-pay | Admitting: Family Medicine

## 2014-02-07 ENCOUNTER — Other Ambulatory Visit: Payer: Self-pay | Admitting: Family Medicine

## 2014-02-07 NOTE — Telephone Encounter (Signed)
Pt requesting medication refill. Last f/u appt 08/2013 with no future appts scheduled. pls advise 

## 2014-02-11 ENCOUNTER — Telehealth: Payer: Self-pay

## 2014-02-11 NOTE — Telephone Encounter (Signed)
Pt has lost flonase and wants replacement called in. Advised pt she should have refills available and to ck with pharmacy but might have to pay out of pocket. Pt voiced understanding.

## 2014-02-16 ENCOUNTER — Other Ambulatory Visit: Payer: Self-pay | Admitting: Family Medicine

## 2014-02-16 MED ORDER — AMPHETAMINE-DEXTROAMPHETAMINE 30 MG PO TABS: 30.0000 mg | ORAL_TABLET | Freq: Two times a day (BID) | ORAL | Status: DC

## 2014-02-16 NOTE — Telephone Encounter (Signed)
Spoke to pt and informed her Rx is available for pick up at the front desk 

## 2014-02-16 NOTE — Telephone Encounter (Signed)
Last filled 01/12/14 

## 2014-02-18 ENCOUNTER — Encounter: Payer: Self-pay | Admitting: Family Medicine

## 2014-02-22 ENCOUNTER — Ambulatory Visit: Payer: BC Managed Care – PPO | Admitting: Family Medicine

## 2014-02-22 DIAGNOSIS — Z0289 Encounter for other administrative examinations: Secondary | ICD-10-CM

## 2014-02-23 ENCOUNTER — Telehealth: Payer: Self-pay | Admitting: Family Medicine

## 2014-02-23 NOTE — Telephone Encounter (Signed)
Patient did not come for their scheduled appointment 02/22/14 for doesn't feel good, achey joints .  Please let me know if the patient needs to be contacted immediately for follow up or if no follow up is necessary.

## 2014-02-23 NOTE — Telephone Encounter (Signed)
No follow up necessary for acute issue if she is feeling better.

## 2014-03-03 ENCOUNTER — Other Ambulatory Visit: Payer: Self-pay | Admitting: Family Medicine

## 2014-03-09 ENCOUNTER — Encounter: Payer: Self-pay | Admitting: Family Medicine

## 2014-03-14 ENCOUNTER — Other Ambulatory Visit: Payer: Self-pay | Admitting: *Deleted

## 2014-03-14 NOTE — Telephone Encounter (Signed)
Pt called and lmom requesting adderall rx.

## 2014-03-15 MED ORDER — AMPHETAMINE-DEXTROAMPHETAMINE 30 MG PO TABS: 30.0000 mg | ORAL_TABLET | Freq: Two times a day (BID) | ORAL | Status: DC

## 2014-03-15 NOTE — Telephone Encounter (Signed)
Lm on pts vm and informed her system issue resolved and Rx is available for pickup at the front desk

## 2014-03-15 NOTE — Addendum Note (Signed)
Addended by: Desmond DikeKNIGHT, Tarsha Blando H on: 03/15/2014 12:20 PM   Modules accepted: Orders

## 2014-03-15 NOTE — Telephone Encounter (Signed)
Lm on pts vm informing her Rx is approved but she will have to pick it up on tomorrow as the system is down and we are unable to print Rx

## 2014-03-15 NOTE — Telephone Encounter (Signed)
Pt left v/m requesting status of adderall rx. Pt request cb when ready for pick up. Pt also noted if cannot write rx to call pt so she will be aware.

## 2014-03-19 ENCOUNTER — Other Ambulatory Visit: Payer: Self-pay | Admitting: Internal Medicine

## 2014-03-31 ENCOUNTER — Other Ambulatory Visit: Payer: Self-pay

## 2014-03-31 MED ORDER — TRAZODONE HCL 100 MG PO TABS: ORAL_TABLET | ORAL | Status: DC

## 2014-03-31 NOTE — Telephone Encounter (Signed)
Rx sent to requested pharmacy. This pt is NOT due for this medication until 10/24

## 2014-03-31 NOTE — Telephone Encounter (Signed)
[facility]ist [facility] left v/m requesting status of Trazodone refill; requested refill 03/28/14 and 03/30/14. [facility]. Last refill done 03/03/14.Please advise.

## 2014-03-31 NOTE — Telephone Encounter (Signed)
Okay #30 x 1 Would leave for Dr A to see if she wants to fill for entire year

## 2014-04-20 ENCOUNTER — Other Ambulatory Visit: Payer: Self-pay

## 2014-06-06 ENCOUNTER — Other Ambulatory Visit: Payer: Self-pay | Admitting: Family Medicine

## 2014-06-08 ENCOUNTER — Other Ambulatory Visit: Payer: Self-pay | Admitting: *Deleted

## 2014-06-08 MED ORDER — TRAZODONE HCL 100 MG PO TABS: ORAL_TABLET | ORAL | Status: DC

## 2014-06-08 NOTE — Addendum Note (Signed)
Addended by: Desmond DikeKNIGHT, Tykwon Fera H on: 06/08/2014 03:29 PM   Modules accepted: Orders

## 2015-08-15 ENCOUNTER — Other Ambulatory Visit: Payer: Self-pay | Admitting: Nurse Practitioner

## 2015-08-15 DIAGNOSIS — M5441 Lumbago with sciatica, right side: Secondary | ICD-10-CM

## 2015-08-18 ENCOUNTER — Emergency Department
Admission: EM | Admit: 2015-08-18 | Discharge: 2015-08-18 | Disposition: A | Discharge status: Home / Self Care | Payer: 59 | Attending: Emergency Medicine | Admitting: Emergency Medicine

## 2015-08-18 ENCOUNTER — Encounter: Payer: Self-pay | Admitting: Emergency Medicine

## 2015-08-18 ENCOUNTER — Emergency Department: Payer: 59

## 2015-08-18 DIAGNOSIS — Z3202 Encounter for pregnancy test, result negative: Secondary | ICD-10-CM | POA: Diagnosis not present

## 2015-08-18 DIAGNOSIS — M546 Pain in thoracic spine: Secondary | ICD-10-CM | POA: Diagnosis not present

## 2015-08-18 DIAGNOSIS — Z79899 Other long term (current) drug therapy: Secondary | ICD-10-CM | POA: Insufficient documentation

## 2015-08-18 LAB — URINALYSIS COMPLETE WITH MICROSCOPIC (ARMC ONLY)
BILIRUBIN URINE: NEGATIVE
GLUCOSE, UA: NEGATIVE mg/dL
HGB URINE DIPSTICK: NEGATIVE
Ketones, ur: NEGATIVE mg/dL
Nitrite: NEGATIVE
Protein, ur: NEGATIVE mg/dL
Specific Gravity, Urine: 1.009 (ref 1.005–1.030)
pH: 6 (ref 5.0–8.0)

## 2015-08-18 LAB — PREGNANCY, URINE: PREG TEST UR: NEGATIVE

## 2015-08-18 MED ORDER — DIAZEPAM 5 MG/ML IJ SOLN
5.0000 mg | Freq: Once | INTRAMUSCULAR | Status: AC
  Administered 2015-08-18: 5 mg via INTRAMUSCULAR
  Filled 2015-08-18: qty 2

## 2015-08-18 MED ORDER — IOHEXOL 300 MG/ML  SOLN
75.0000 mL | Freq: Once | INTRAMUSCULAR | Status: DC | PRN
  Filled 2015-08-18: qty 75

## 2015-08-18 MED ORDER — ONDANSETRON 4 MG PO TBDP
4.0000 mg | ORAL_TABLET | Freq: Once | ORAL | Status: AC
  Administered 2015-08-18: 4 mg via ORAL
  Filled 2015-08-18: qty 1

## 2015-08-18 MED ORDER — KETOROLAC TROMETHAMINE 60 MG/2ML IM SOLN
60.0000 mg | Freq: Once | INTRAMUSCULAR | Status: AC
  Administered 2015-08-18: 60 mg via INTRAMUSCULAR
  Filled 2015-08-18: qty 2

## 2015-08-18 MED ORDER — OXYCODONE-ACETAMINOPHEN 5-325 MG PO TABS: 1.0000 | ORAL_TABLET | ORAL | Status: DC | PRN

## 2015-08-18 MED ORDER — GABAPENTIN 300 MG PO CAPS: 300.0000 mg | ORAL_CAPSULE | Freq: Three times a day (TID) | ORAL | Status: DC

## 2015-08-18 NOTE — ED Notes (Addendum)
Patient states that 3 weeks ago she was seen at urgent care for upper back pain, pain shoots down right leg and causes numbness and tingling sensation in her leg. Patient was given Prednisone taper, Pain seemed to improve some but Sunday it flared up again. Patient went to her PCP on Tuesday and was given another prednisone taper and MRI was ordered for next Thursday.  Patient states that her PCP called her today and advised her to come to the ED because they felt as if she was not responding to the treatment. Patient states that pain is almost unbearable today. Patient tearful and unable to sit still at this time.

## 2015-08-18 NOTE — ED Notes (Signed)
Patient able to ambulate to the bathroom without difficulty.

## 2015-08-18 NOTE — ED Provider Notes (Signed)
[facility] Emergency Department Provider Note  ____________________________________________  Time seen: Approximately 5:40 PM  I have reviewed the triage vital signs and the nursing notes.   HISTORY  Chief Complaint Back Pain    HPI Joanna Walker is a 40 y.o. female who presents for evaluation of mid back pain 3 weeks. Patient has been seen by both urgent care and her PCP. Initially given a prescription forhydrocodone, Robaxin-750 and prednisone tapering dose. Patient states initially started feeling a little bit better and then re-exacerbated her symptoms 4 days ago. Started on another prednisone taper and decreased Robaxin 500 mg 4 times a day. Patient has been taking Tylenol daily and states that the symptoms today or worsen or have been. She complains of radiation and numbness to the right outer aspect of her right leg. Denies any saddle paresthesia or numbness. Denies any loss of bladder control. Cannot get a comfortable position. Movement seems to make it worse. Presently pain is an 8/10. Past Medical History  Diagnosis Date  . Anxiety   . ADD (attention deficit disorder)   . Allergy     Patient Active Problem List   Diagnosis Date Noted  . Chest pain 10/20/2013  . Allergic rhinitis 09/28/2013  . Positive urine drug screen 08/16/2013  . ADD (attention deficit disorder) 02/23/2013  . Migraine with aura 01/13/2013  . Anxiety and depression   . Allergy     Past Surgical History  Procedure Laterality Date  . Cholecystectomy      Current Outpatient Rx  Name  Route  Sig  Dispense  Refill  . fluticasone (FLONASE) 50 MCG/ACT nasal spray      TWO PUFFS IN EACH NOSTRIL ONCE A DAY   16 g   1   . gabapentin (NEURONTIN) 300 MG capsule   Oral   Take 1 capsule (300 mg total) by mouth 3 (three) times daily.   30 capsule   0   . levonorgestrel-ethinyl estradiol (ALTAVERA) 0.15-30 MG-MCG tablet      TAKE 1 TABLET BY MOUTH ONCE A DAY   28  tablet   11     Pt does not take placebo pills, may fill every 21  ...   . Multiple Vitamins-Minerals (MULTIVITAMIN PO)   Oral   Take 1 tablet by mouth daily.         . Omega-3 Fatty Acids (FISH OIL) 1000 MG CAPS   Oral   Take 1 capsule by mouth 2 (two) times daily.         Marland Kitchen. oxyCODONE-acetaminophen (ROXICET) 5-325 MG tablet   Oral   Take 1-2 tablets by mouth every 4 (four) hours as needed for severe pain.   15 tablet   0   . vitamin C (ASCORBIC ACID) 500 MG tablet   Oral   Take 500 mg by mouth 2 (two) times daily.           Allergies Review of patient's allergies indicates no known allergies.  Family History  Problem Relation Age of Onset  . Adopted: Yes    Social History Social History  Substance Use Topics  . Smoking status: Never Smoker   . Smokeless tobacco: None  . Alcohol Use: Yes     Comment: rare    Review of Systems Constitutional: No fever/chills Cardiovascular: Denies chest pain. Respiratory: Denies shortness of breath. Gastrointestinal: No abdominal pain.  No nausea, no vomiting.  No diarrhea.  No constipation. Genitourinary: Negative for dysuria. Musculoskeletal: Positive for mid  thoracic back pain. Skin: Negative for rash. Neurological: Negative for headaches, focal weakness or numbness.  10-point ROS otherwise negative.  ____________________________________________   PHYSICAL EXAM:  VITAL SIGNS: ED Triage Vitals  Enc Vitals Group     BP 08/18/15 1700 157/95 mmHg     Pulse Rate 08/18/15 1700 106     Resp 08/18/15 1700 18     Temp 08/18/15 1700 98.4 F (36.9 C)     Temp Source 08/18/15 1700 Oral     SpO2 08/18/15 1700 98 %     Weight 08/18/15 1700 154 lb (69.854 kg)     Height 08/18/15 1700  (1.575 m)     Head Cir --      Peak Flow --      Pain Score 08/18/15 1701 7     Pain Loc --      Pain Edu? --      Excl. in GC? --     Constitutional: Alert and oriented. Well appearing and in no acute  distress. Cardiovascular: Normal rate, regular rhythm. Grossly normal heart sounds.  Good peripheral circulation. Respiratory: Normal respiratory effort.  No retractions. Lungs CTAB. Gastrointestinal: Soft and nontender. No distention. No abdominal bruits. No CVA tenderness. Musculoskeletal: Point tenderness to the midthoracic spine. No lumbar tenderness straight leg raise unremarkable bilateral distally neurovascularly intact bilaterally. Good strength bilateral lower extremities with extension and flexion. Neurologic:  Normal speech and language. No gross focal neurologic deficits are appreciated. No gait instability. Skin:  Skin is warm, dry and intact. No rash noted. Psychiatric: Mood and affect are normal. Speech and behavior are normal.  ____________________________________________   LABS (all labs ordered are listed, but only abnormal results are displayed)  Labs Reviewed  URINALYSIS COMPLETEWITH MICROSCOPIC ([facility] ONLY) - Abnormal; Notable for the following:    Color, Urine STRAW (*)    APPearance CLEAR (*)    Leukocytes, UA TRACE (*)    Bacteria, UA RARE (*)    Squamous Epithelial / LPF 0-5 (*)    All other components within normal limits  PREGNANCY, URINE   ____________________________________________   RADIOLOGY  CT thoracic spine negative for any acute findings. ____________________________________________   PROCEDURES  Procedure(s) performed: None  Critical Care performed: No  ____________________________________________   INITIAL IMPRESSION / ASSESSMENT AND PLAN / ED COURSE  Pertinent labs & imaging results that were available during my care of the patient were reviewed by me and considered in my medical decision making (see chart for details).  Thoracic back pain with no known etiology. Minimal relief with Toradol 60 mg and Valium 5 mg injections. We'll try the patient on short course of gabapentin in [place]. She has Phenergan at home to take. She is to  return in 24 hours if worsening symptomology. She voices no other emergency medical complaints at this time. ____________________________________________   FINAL CLINICAL IMPRESSION(S) / ED DIAGNOSES  Final diagnoses:  Thoracic spine pain     This chart was dictated using voice recognition software/Dragon. Despite best efforts to proofread, errors can occur which can change the meaning. Any change was purely unintentional.   Evangeline Dakin, PA-C 08/18/15 1911  Minna Antis, MD 08/18/15 [phone]

## 2015-08-18 NOTE — Discharge Instructions (Signed)
Back Pain, Adult °Back pain is very common in adults. The cause of back pain is rarely dangerous and the pain often gets better over time. The cause of your back pain may not be known. Some common causes of back pain include: °· Strain of the muscles or ligaments supporting the spine. °· Wear and tear (degeneration) of the spinal disks. °· Arthritis. °· Direct injury to the back. °For many people, back pain may return. Since back pain is rarely dangerous, most people can learn to manage this condition on their own. °HOME CARE INSTRUCTIONS °Watch your back pain for any changes. The following actions may help to lessen any discomfort you are feeling: °· Remain active. It is stressful on your back to sit or stand in one place for long periods of time. Do not sit, drive, or stand in one place for more than 30 minutes at a time. Take short walks on even surfaces as soon as you are able. Try to increase the length of time you walk each day. °· Exercise regularly as directed by your health care provider. Exercise helps your back heal faster. It also helps avoid future injury by keeping your muscles strong and flexible. °· Do not stay in bed. Resting more than 1-2 days can delay your recovery. °· Pay attention to your body when you bend and lift. The most comfortable positions are those that put less stress on your recovering back. Always use proper lifting techniques, including: °· Bending your knees. °· Keeping the load close to your body. °· Avoiding twisting. °· Find a comfortable position to sleep. Use a firm mattress and lie on your side with your knees slightly bent. If you lie on your back, put a pillow under your knees. °· Avoid feeling anxious or stressed. Stress increases muscle tension and can worsen back pain. It is important to recognize when you are anxious or stressed and learn ways to manage it, such as with exercise. °· Take medicines only as directed by your health care provider. Over-the-counter  medicines to reduce pain and inflammation are often the most helpful. Your health care provider may prescribe muscle relaxant drugs. These medicines help dull your pain so you can more quickly return to your normal activities and healthy exercise. °· Apply ice to the injured area: °· Put ice in a plastic bag. °· Place a towel between your skin and the bag. °· Leave the ice on for 20 minutes, 2-3 times a day for the first 2-3 days. After that, ice and heat may be alternated to reduce pain and spasms. °· Maintain a healthy weight. Excess weight puts extra stress on your back and makes it difficult to maintain good posture. °SEEK MEDICAL CARE IF: °· You have pain that is not relieved with rest or medicine. °· You have increasing pain going down into the legs or buttocks. °· You have pain that does not improve in one week. °· You have night pain. °· You lose weight. °· You have a fever or chills. °SEEK IMMEDIATE MEDICAL CARE IF:  °· You develop new bowel or bladder control problems. °· You have unusual weakness or numbness in your arms or legs. °· You develop nausea or vomiting. °· You develop abdominal pain. °· You feel faint. °  °This information is not intended to replace advice given to you by your health care provider. Make sure you discuss any questions you have with your health care provider. °  °Document Released: 06/03/2005 Document Revised: 06/24/2014 Document Reviewed: 10/05/2013 °Elsevier Interactive Patient Education ©2016 Elsevier   Inc.  Pain Without a Known Cause WHAT IS PAIN WITHOUT A KNOWN CAUSE? Pain can occur in any part of the body and can range from mild to severe. Sometimes no cause can be found for why you are having pain. Some types of pain that can occur without a known cause include:   Headache.  Back pain.  Abdominal pain.  Neck pain. HOW IS PAIN WITHOUT A KNOWN CAUSE DIAGNOSED?  Your health care provider will try to find the cause of your pain. This may include:  Physical  exam.  Medical history.  Blood tests.  Urine tests.  X-rays. If no cause is found, your health care provider may diagnose you with pain without a known cause.  IS THERE TREATMENT FOR PAIN WITHOUT A CAUSE?  Treatment depends on the kind of pain you have. Your health care provider may prescribe medicines to help relieve your pain.  WHAT CAN I DO AT HOME FOR MY PAIN?   Take medicines only as directed by your health care provider.  Stop any activities that cause pain. During periods of severe pain, bed rest may help.  Try to reduce your stress with activities such as yoga or meditation. Talk to your health care provider for other stress-reducing activity recommendations.  Exercise regularly, if approved by your health care provider.  Eat a healthy diet that includes fruits and vegetables. This may improve pain. Talk to your health care provider if you have any questions about your diet. WHAT IF MY PAIN DOES NOT GET BETTER?  If you have a painful condition and no reason can be found for the pain or the pain gets worse, it is important to follow up with your health care provider. It may be necessary to repeat tests and look further for a possible cause.    This information is not intended to replace advice given to you by your health care provider. Make sure you discuss any questions you have with your health care provider.   Document Released: 02/26/2001 Document Revised: 06/24/2014 Document Reviewed: 10/19/2013 Elsevier Interactive Patient Education [employer]2016 Elsevier Inc.

## 2015-08-18 NOTE — ED Notes (Signed)
C/o right sided mid to low back pain, radiating down through buttock and down right leg.  Denies any injury.  Also c/o intermittent numbness to right upper leg.  Denies GI/ GU problems.  Seen at [facility] 3 weeks ago for same, treated with prednisone.  Seen by PCP for continued pain, started on 2nd round of medication and muscle relaxers.  PCP called patient today to check on status and referred patient ED for evaluation due to not responding to treatment.

## 2015-08-18 NOTE — ED Notes (Signed)
Pt. Going home with husband. 

## 2015-08-24 ENCOUNTER — Other Ambulatory Visit: Payer: Self-pay

## 2015-09-05 ENCOUNTER — Ambulatory Visit: Payer: Self-pay

## 2015-09-06 ENCOUNTER — Ambulatory Visit: Admission: RE | Admit: 2015-09-06 | Payer: 59 | Source: Ambulatory Visit

## 2016-09-23 NOTE — Discharge Instructions (Signed)
[facility] °[facility] °ENDOSCOPIC SINUS SURGERY °[facility] ° °What is Functional Endoscopic Sinus Surgery? ° The Surgery involves making the natural openings of the sinuses larger by removing the bony partitions that separate the sinuses from the nasal cavity.  The natural sinus lining is preserved as much as possible to allow the sinuses to resume normal function after the surgery.  In some patients nasal polyps (excessively swollen lining of the sinuses) may be removed to relieve obstruction of the sinus openings.  The surgery is performed through the nose using lighted scopes, which eliminates the need for incisions on the face.  A septoplasty is a different procedure which is sometimes performed with sinus surgery.  It involves straightening the boy partition that separates the two sides of your nose.  A crooked or deviated septum may need repair if is obstructing the sinuses or nasal airflow.  Turbinate reduction is also often performed during sinus surgery.  The turbinates are bony proturberances from the side walls of the nose which swell and can obstruct the nose in patients with sinus and allergy problems.  Their size can be surgically reduced to help relieve nasal obstruction. ° °What Can Sinus Surgery Do For Me? ° Sinus surgery can reduce the frequency of sinus infections requiring antibiotic treatment.  This can provide improvement in nasal congestion, post-nasal drainage, facial pressure and nasal obstruction.  Surgery will NOT prevent you from ever having an infection again, so it usually only for patients who get infections 4 or more times yearly requiring antibiotics, or for infections that do not clear with antibiotics.  It will not cure nasal allergies, so patients with allergies may still require medication to treat their allergies after surgery. Surgery may improve headaches related to sinusitis, however, some people will continue to  require medication to control sinus headaches related to allergies.  Surgery will do nothing for other forms of headache (migraine, tension or cluster). ° °What Are the Risks of Endoscopic Sinus Surgery? ° Current techniques allow surgery to be performed safely with little risk, however, there are rare complications that patients should be aware of.  Because the sinuses are located around the eyes, there is risk of eye injury, including blindness, though again, this would be quite rare. This is usually a result of bleeding behind the eye during surgery, which puts the vision oat risk, though there are treatments to protect the vision and prevent permanent disrupted by surgery causing a leak of the spinal fluid that surrounds the brain.  More serious complications would include bleeding inside the brain cavity or damage to the brain.  Again, all of these complications are uncommon, and spinal fluid leaks can be safely managed surgically if they occur.  The most common complication of sinus surgery is bleeding from the nose, which may require packing or cauterization of the nose.  Continued sinus have polyps may experience recurrence of the polyps requiring revision surgery.  Alterations of sense of smell or injury to the tear ducts are also rare complications.  ° °What is the Surgery Like, and what is the Recovery? ° The Surgery usually takes a couple of hours to perform, and is usually performed under a general anesthetic (completely asleep).  Patients are usually discharged home after a couple of hours.  Sometimes during surgery it is necessary to pack the nose to control bleeding, and the packing is left in place for 24 - 48 hours, and removed by your surgeon.    If a septoplasty was performed during the procedure, there is often a splint placed which must be removed after 5-7 days.   °Discomfort: Pain is usually mild to moderate, and can be controlled by prescription pain medication or acetaminophen (Tylenol).   Aspirin, Ibuprofen (Advil, Motrin), or Naprosyn (Aleve) should be avoided, as they can cause increased bleeding.  Most patients feel sinus pressure like they have a bad head cold for several days.  Sleeping with your head elevated can help reduce swelling and facial pressure, as can ice packs over the face.  A humidifier may be helpful to keep the mucous and blood from drying in the nose.  ° °Diet: There are no specific diet restrictions, however, you should generally start with clear liquids and a light diet of bland foods because the anesthetic can cause some nausea.  Advance your diet depending on how your stomach feels.  Taking your pain medication with food will often help reduce stomach upset which pain medications can cause. ° °Nasal Saline Irrigation: It is important to remove blood clots and dried mucous from the nose as it is healing.  This is done by having you irrigate the nose at least 3 - 4 times daily with a salt water solution.  We recommend using NeilMed Sinus Rinse (available at the drug store).  Fill the squeeze bottle with the solution, bend over a sink, and insert the tip of the squeeze bottle into the nose ½ of an inch.  Point the tip of the squeeze bottle towards the inside corner of the eye on the same side your irrigating.  Squeeze the bottle and gently irrigate the nose.  If you bend forward as you do this, most of the fluid will flow back out of the nose, instead of down your throat.   The solution should be warm, near body temperature, when you irrigate.   Each time you irrigate, you should use a full squeeze bottle.  ° °Note that if you are instructed to use Nasal Steroid Sprays at any time after your surgery, irrigate with saline BEFORE using the steroid spray, so you do not wash it all out of the nose. °Another product, Nasal Saline Gel (such as AYR Nasal Saline Gel) can be applied in each nostril 3 - 4 times daily to moisture the nose and reduce scabbing or crusting. ° °Bleeding:   Bloody drainage from the nose can be expected for several days, and patients are instructed to irrigate their nose frequently with salt water to help remove mucous and blood clots.  The drainage may be dark red or brown, though some fresh blood may be seen intermittently, especially after irrigation.  Do not blow you nose, as bleeding may occur. If you must sneeze, keep your mouth open to allow air to escape through your mouth. ° °If heavy bleeding occurs: Irrigate the nose with saline to rinse out clots, then spray the nose 3 - 4 times with Afrin Nasal Decongestant Spray.  The spray will constrict the blood vessels to slow bleeding.  Pinch the lower half of your nose shut to apply pressure, and lay down with your head elevated.  Ice packs over the nose may help as well. If bleeding persists despite these measures, you should notify your doctor.  Do not use the Afrin routinely to control nasal congestion after surgery, as it can result in worsening congestion and may affect healing.  ° °Activity: Return to work varies among patients. Most patients will be out of   work at least 5 - 7 days to recover.  Patient may return to work after they are off of narcotic pain medication, and feeling well enough to perform the functions of their job.  Patients must avoid heavy lifting (over 10 pounds) or strenuous physical for 2 weeks after surgery, so your employer may need to assign you to light duty, or keep you out of work longer if light duty is not possible.  NOTE: you should not drive, operate dangerous machinery, do any mentally demanding tasks or make any important legal or financial decisions while on narcotic pain medication and recovering from the general anesthetic.  °  °Call Your Doctor Immediately if You Have Any of the Following: °1. Bleeding that you cannot control with the above measures °2. Loss of vision, double vision, bulging of the eye or black eyes. °3. Fever over 101 degrees °4. Neck stiffness with severe  headache, fever, nausea and change in mental state. °You are always encourage to call anytime with concerns, however, please call with requests for pain medication refills during office hours. ° °Office Endoscopy: During follow-up visits your doctor will remove any packing or splints that may have been placed and evaluate and clean your sinuses endoscopically.  Topical anesthetic will be used to make this as comfortable as possible, though you may want to take your pain medication prior to the visit.  How often this will need to be done varies from patient to patient.  After complete recovery from the surgery, you may need follow-up endoscopy from time to time, particularly if there is concern of recurrent infection or nasal polyps. ° ° °General Anesthesia, Adult, Care After °These instructions provide you with information about caring for yourself after your procedure. Your health care provider may also give you more specific instructions. Your treatment has been planned according to current medical practices, but problems sometimes occur. Call your health care provider if you have any problems or questions after your procedure. °What can I expect after the procedure? °After the procedure, it is common to have: °· Vomiting. °· A sore throat. °· Mental slowness. °It is common to feel: °· Nauseous. °· Cold or shivery. °· Sleepy. °· Tired. °· Sore or achy, even in parts of your body where you did not have surgery. °Follow these instructions at home: °For at least 24 hours after the procedure: °· Do not: °¨ Participate in activities where you could fall or become injured. °¨ Drive. °¨ Use heavy machinery. °¨ Drink alcohol. °¨ Take sleeping pills or medicines that cause drowsiness. °¨ Make important decisions or sign legal documents. °¨ Take care of children on your own. °· Rest. °Eating and drinking °· If you vomit, drink water, juice, or soup when you can drink without vomiting. °· Drink enough fluid to keep your  urine clear or pale yellow. °· Make sure you have little or no nausea before eating solid foods. °· Follow the diet recommended by your health care provider. °General instructions °· Have a responsible adult stay with you until you are awake and alert. °· Return to your normal activities as told by your health care provider. Ask your health care provider what activities are safe for you. °· Take over-the-counter and prescription medicines only as told by your health care provider. °· If you smoke, do not smoke without supervision. °· Keep all follow-up visits as told by your health care provider. This is important. °Contact a health care provider if: °· You continue to have nausea   or vomiting at home, and medicines are not helpful. °· You cannot drink fluids or start eating again. °· You cannot urinate after 8-12 hours. °· You develop a skin rash. °· You have fever. °· You have increasing redness at the site of your procedure. °Get help right away if: °· You have difficulty breathing. °· You have chest pain. °· You have unexpected bleeding. °· You feel that you are having a life-threatening or urgent problem. °This information is not intended to replace advice given to you by your health care provider. Make sure you discuss any questions you have with your health care provider. °Document Released: 09/09/2000 Document Revised: 11/06/2015 Document Reviewed: 05/18/2015 °Elsevier Interactive Patient Education © 2017 [employer] ° °

## 2016-09-27 ENCOUNTER — Encounter: Admission: RE | Payer: Self-pay | Source: Ambulatory Visit

## 2016-09-27 ENCOUNTER — Ambulatory Visit
Admission: RE | Admit: 2016-09-27 | Payer: BLUE CROSS/BLUE SHIELD | Source: Ambulatory Visit | Admitting: Unknown Physician Specialty

## 2016-09-27 SURGERY — SEPTOPLASTY, NOSE
Anesthesia: General

## 2017-02-10 ENCOUNTER — Emergency Department: Payer: BLUE CROSS/BLUE SHIELD

## 2017-02-10 ENCOUNTER — Emergency Department
Admission: EM | Admit: 2017-02-10 | Discharge: 2017-02-10 | Disposition: A | Discharge status: Home / Self Care | Payer: BLUE CROSS/BLUE SHIELD | Attending: Emergency Medicine | Admitting: Emergency Medicine

## 2017-02-10 ENCOUNTER — Encounter: Payer: Self-pay | Admitting: Medical Oncology

## 2017-02-10 DIAGNOSIS — M549 Dorsalgia, unspecified: Secondary | ICD-10-CM | POA: Insufficient documentation

## 2017-02-10 DIAGNOSIS — M79672 Pain in left foot: Secondary | ICD-10-CM | POA: Diagnosis not present

## 2017-02-10 DIAGNOSIS — R0602 Shortness of breath: Secondary | ICD-10-CM | POA: Diagnosis not present

## 2017-02-10 DIAGNOSIS — R109 Unspecified abdominal pain: Secondary | ICD-10-CM | POA: Diagnosis not present

## 2017-02-10 DIAGNOSIS — Z79899 Other long term (current) drug therapy: Secondary | ICD-10-CM | POA: Insufficient documentation

## 2017-02-10 LAB — COMPREHENSIVE METABOLIC PANEL
ALT: 23 U/L (ref 14–54)
AST: 22 U/L (ref 15–41)
Albumin: 3.5 g/dL (ref 3.5–5.0)
Alkaline Phosphatase: 53 U/L (ref 38–126)
Anion gap: 6 (ref 5–15)
BUN: 14 mg/dL (ref 6–20)
CO2: 27 mmol/L (ref 22–32)
Calcium: 8.8 mg/dL — ABNORMAL LOW (ref 8.9–10.3)
Chloride: 107 mmol/L (ref 101–111)
Creatinine, Ser: 0.69 mg/dL (ref 0.44–1.00)
GFR calc Af Amer: >60 mL/min (ref >60)
GFR calc non Af Amer: >60 mL/min (ref >60)
Glucose, Bld: 95 mg/dL (ref 65–99)
Potassium: 3.6 mmol/L (ref 3.5–5.1)
Sodium: 140 mmol/L (ref 135–145)
Total Bilirubin: 0.2 mg/dL — ABNORMAL LOW (ref 0.3–1.2)
Total Protein: 6.7 g/dL (ref 6.5–8.1)

## 2017-02-10 LAB — CBC WITH DIFFERENTIAL/PLATELET
Basophils Absolute: 0.1 10*3/uL (ref 0–0.1)
Basophils Relative: 1 %
Eosinophils Absolute: 0.4 10*3/uL (ref 0–0.7)
Eosinophils Relative: 5 %
HCT: 35.6 % (ref 35.0–47.0)
Hemoglobin: 12.3 g/dL (ref 12.0–16.0)
Lymphocytes Relative: 27 %
Lymphs Abs: 2.2 10*3/uL (ref 1.0–3.6)
MCH: 29.7 pg (ref 26.0–34.0)
MCHC: 34.5 g/dL (ref 32.0–36.0)
MCV: 86.1 fL (ref 80.0–100.0)
Monocytes Absolute: 0.7 10*3/uL (ref 0.2–0.9)
Monocytes Relative: 9 %
Neutro Abs: 4.7 10*3/uL (ref 1.4–6.5)
Neutrophils Relative %: 58 %
Platelets: 299 10*3/uL (ref 150–440)
RBC: 4.14 MIL/uL (ref 3.80–5.20)
RDW: 12.8 % (ref 11.5–14.5)
WBC: 8 10*3/uL (ref 3.6–11.0)

## 2017-02-10 LAB — POCT PREGNANCY, URINE: Preg Test, Ur: NEGATIVE

## 2017-02-10 MED ORDER — MELOXICAM 7.5 MG PO TABS: 7.5000 mg | ORAL_TABLET | Freq: Every day | ORAL | 1 refills | Status: AC

## 2017-02-10 MED ORDER — ORPHENADRINE CITRATE 30 MG/ML IJ SOLN
60.0000 mg | Freq: Two times a day (BID) | INTRAMUSCULAR | Status: DC
  Administered 2017-02-10: 60 mg via INTRAVENOUS
  Filled 2017-02-10: qty 2

## 2017-02-10 MED ORDER — CYCLOBENZAPRINE HCL 10 MG PO TABS: 10.0000 mg | ORAL_TABLET | Freq: Three times a day (TID) | ORAL | 0 refills | Status: AC | PRN

## 2017-02-10 MED ORDER — IOPAMIDOL (ISOVUE-300) INJECTION 61%
100.0000 mL | Freq: Once | INTRAVENOUS | Status: AC | PRN
  Administered 2017-02-10: 100 mL via INTRAVENOUS
  Filled 2017-02-10: qty 100

## 2017-02-10 MED ORDER — ORPHENADRINE CITRATE 30 MG/ML IJ SOLN: 60.0000 mg | Freq: Two times a day (BID) | INTRAMUSCULAR | Status: DC

## 2017-02-10 MED ORDER — KETOROLAC TROMETHAMINE 30 MG/ML IJ SOLN: 30.0000 mg | Freq: Once | INTRAMUSCULAR | Status: DC

## 2017-02-10 MED ORDER — KETOROLAC TROMETHAMINE 30 MG/ML IJ SOLN
30.0000 mg | Freq: Once | INTRAMUSCULAR | Status: AC
  Administered 2017-02-10: 30 mg via INTRAVENOUS
  Filled 2017-02-10: qty 1
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
## 2017-02-10 NOTE — ED Notes (Signed)
Pt ambulatory to toilet. Given grippy socks to ambulate.

## 2017-02-10 NOTE — ED Notes (Signed)
CT notified of negative urine preg.  
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
## 2017-02-10 NOTE — ED Notes (Signed)
Pt stating that the medications are not helping. Annice Pih PA informed. Pt given ice pack for neck.

## 2017-03-25 ENCOUNTER — Emergency Department: Payer: 59

## 2017-03-25 ENCOUNTER — Emergency Department
Admission: EM | Admit: 2017-03-25 | Discharge: 2017-03-26 | Disposition: A | Discharge status: Home / Self Care | Payer: 59 | Attending: Emergency Medicine | Admitting: Emergency Medicine

## 2017-03-25 DIAGNOSIS — K5900 Constipation, unspecified: Secondary | ICD-10-CM | POA: Diagnosis not present

## 2017-03-25 DIAGNOSIS — Z79899 Other long term (current) drug therapy: Secondary | ICD-10-CM | POA: Diagnosis not present

## 2017-03-25 LAB — POCT PREGNANCY, URINE: Preg Test, Ur: NEGATIVE

## 2017-03-25 MED ORDER — MINERAL OIL RE ENEM
1.0000 | ENEMA | Freq: Once | RECTAL | Status: AC
  Administered 2017-03-26: 1 via RECTAL

## 2017-03-25 MED ORDER — POLYETHYLENE GLYCOL 3350 17 G PO PACK
17.0000 g | PACK | Freq: Every day | ORAL | Status: DC
  Administered 2017-03-26: 17 g via ORAL

## 2017-03-25 MED ORDER — MAGNESIUM CITRATE PO SOLN
1.0000 | Freq: Once | ORAL | Status: AC
  Administered 2017-03-26: 1 via ORAL
  Filled 2017-03-25: qty 296

## 2017-03-25 MED ORDER — DOCUSATE SODIUM 100 MG PO CAPS
100.0000 mg | ORAL_CAPSULE | Freq: Once | ORAL | Status: AC
  Administered 2017-03-26: 100 mg via ORAL
  Filled 2017-03-25: qty 1

## 2017-03-25 NOTE — ED Triage Notes (Signed)
Patient here for complaint of constipation. States she has not had a bowel movement in six days. Today is hurting in her back and it is not getting any better. Spoke with PCP and they told her to come here. No results with suppository last night without results and an enema tonight that yielded no results. Denies vomiting but has nausea.

## 2017-03-25 NOTE — ED Notes (Signed)
Pt reports she has not had a BM in 6 days - pt reports that she is now having lower abd and lower back pain that started 2 days ago - reports nausea - denies vomiting - pt took supp last pm and an enema tonight both without results

## 2017-03-25 NOTE — ED Provider Notes (Signed)
[facility] Emergency Department Provider Note     First MD Initiated Contact with Patient 03/25/17 2249     (approximate)  I have reviewed the triage vital signs and the nursing notes.   HISTORY  Chief Complaint Constipation    HPI RUMOR SUN is a 41 y.o. female [facility] emergency department with a six-day history of constipation. Patient states that she has had no bowel movements in the course of 6 days with generalized abdominal cramping. Patient states that she used suppository and enema at home without any results.   Past Medical History:  Diagnosis Date  . ADD (attention deficit disorder)   . Allergy   . Anxiety     Patient Active Problem List   Diagnosis Date Noted  . Chest pain 10/20/2013  . Allergic rhinitis 09/28/2013  . Positive urine drug screen 08/16/2013  . ADD (attention deficit disorder) 02/23/2013  . Migraine with aura 01/13/2013  . Anxiety and depression   . Allergy     Past Surgical History:  Procedure Laterality Date  . CHOLECYSTECTOMY      Prior to Admission medications   Medication Sig Start Date End Date Taking? Authorizing Provider  fluticasone (FLONASE) 50 MCG/ACT nasal spray TWO PUFFS IN EACH NOSTRIL ONCE A DAY 06/06/14   Dianne Dun, MD  gabapentin (NEURONTIN) 300 MG capsule Take 1 capsule (300 mg total) by mouth 3 (three) times daily. 08/18/15 08/17/16  Beers, Charmayne Sheer, PA-C  levonorgestrel-ethinyl estradiol Erenest Rasher) 0.15-30 MG-MCG tablet TAKE 1 TABLET BY MOUTH ONCE A DAY 06/28/13   Lorre Munroe, NP  Multiple Vitamins-Minerals (MULTIVITAMIN PO) Take 1 tablet by mouth daily.    [provider]  Omega-3 Fatty Acids (FISH OIL) 1000 MG CAPS Take 1 capsule by mouth 2 (two) times daily.    [provider]  oxyCODONE-acetaminophen (ROXICET) 5-325 MG tablet Take 1-2 tablets by mouth every 4 (four) hours as needed for severe pain. 08/18/15   Beers, Charmayne Sheer, PA-C  vitamin C (ASCORBIC ACID) 500  MG tablet Take 500 mg by mouth 2 (two) times daily.    [provider]    Allergies No known drug allergies  Family History  Problem Relation Age of Onset  . Adopted: Yes    Social History Social History  Substance Use Topics  . Smoking status: Never Smoker  . Smokeless tobacco: Never Used  . Alcohol use Yes     Comment: rare    Review of Systems  Constitutional: No fever/chills Eyes: No visual changes. ENT: No sore throat. Cardiovascular: Denies chest pain. Respiratory: Denies shortness of breath. Gastrointestinal: No abdominal pain.  No nausea, no vomiting.  No diarrhea.  Positive for constipation Genitourinary: Negative for dysuria. Musculoskeletal: Negative for back pain. Skin: Negative for rash. Neurological: Negative for headaches, focal weakness or numbness.   ____________________________________________   PHYSICAL EXAM:  VITAL SIGNS: ED Triage Vitals  Enc Vitals Group     BP 03/25/17 2231 121/86     Pulse Rate 03/25/17 2026 79     Resp 03/25/17 2026 18     Temp 03/25/17 2034 98 F (36.7 C)     Temp Source 03/25/17 2034 Oral     SpO2 03/25/17 2026 98 %     Weight 03/25/17 2028 68.9 kg (152 lb)     Height 03/25/17 2028 1.6 m ( )     Head Circumference --      Peak Flow --      Pain  Score 03/25/17 2026 7     Pain Loc --      Pain Edu? --      Excl. in GC? --     Constitutional: Alert and oriented. Well appearing and in no acute distress. Eyes: Conjunctivae are normal. PERRL. EOMI. Head: Atraumatic. Nose: No congestion/rhinnorhea. Mouth/Throat: Mucous membranes are moist.  Oropharynx non-erythematous. Neck: No stridor.   Cardiovascular: Normal rate, regular rhythm. Grossly normal heart sounds.  Good peripheral circulation. Respiratory: Normal respiratory effort.  No retractions. Lungs CTAB. Gastrointestinal: Soft and nontender. No distention. No abdominal bruits. No CVA tenderness. Musculoskeletal: No lower extremity tenderness nor  edema.  No joint effusions. Neurologic:  Normal speech and language. No gross focal neurologic deficits are appreciated. No gait instability. Skin:  Skin is warm, dry and intact. No rash noted. Psychiatric: Mood and affect are normal. Speech and behavior are normal.  ____________________________________________   LABS (all labs ordered are listed, but only abnormal results are displayed)  Labs Reviewed  POCT PREGNANCY, URINE  POC URINE PREG, ED      Procedures   ____________________________________________   INITIAL IMPRESSION / ASSESSMENT AND PLAN / ED COURSE  As part of my medical decision making, I reviewed the following data within the electronic MEDICAL RECORD NUMBER  1 view abdominal x-ray: which revealed no air-fluid levels with moderate to large amount of stool in the colon.    Differential diagnosis: Constipation (most likely given history and physical exam) Bowel obstruction  Patient given a soapsuds enema and MiraLAX with small bowel movement. Patient will be prescribed MiraLAX at home. ____________________________________________   FINAL CLINICAL IMPRESSION(S) / ED DIAGNOSES  Final diagnoses:  Constipation, unspecified constipation type      NEW MEDICATIONS STARTED DURING THIS VISIT:  New Prescriptions   No medications on file     Note:  This document was prepared using Dragon voice recognition software and may include unintentional dictation errors.   Darci Current, MD 03/26/17 [phone]

## 2017-03-26 MED ORDER — POLYETHYLENE GLYCOL 3350 17 G PO PACK
PACK | ORAL | Status: AC
  Filled 2017-03-26: qty 1

## 2017-03-26 MED ORDER — POLYETHYLENE GLYCOL 3350 17 G PO PACK: 17.0000 g | PACK | Freq: Every day | ORAL | 0 refills | Status: DC | PRN

## 2017-03-26 NOTE — ED Notes (Signed)
Enema given. Only half infused. Pt did not tolerate well. Pt only held it a minute or so.

## 2017-03-26 NOTE — ED Notes (Signed)
Pt continues to lay on her left side. Pt is holding the enema.

## 2017-03-26 NOTE — ED Notes (Signed)
2nd attempt at enema. Pt tolerated it much better. Pt is holding well. Instructed to pt to close her eyes relax and hold as long as possible.

## 2017-04-02 ENCOUNTER — Encounter: Payer: Self-pay | Admitting: Gastroenterology

## 2019-06-23 ENCOUNTER — Ambulatory Visit: Payer: 59 | Attending: Internal Medicine

## 2019-06-23 DIAGNOSIS — Z20822 Contact with and (suspected) exposure to covid-19: Secondary | ICD-10-CM

## 2019-06-24 LAB — NOVEL CORONAVIRUS, NAA: SARS-CoV-2, NAA: NOT DETECTED

## 2019-12-03 ENCOUNTER — Other Ambulatory Visit (HOSPITAL_COMMUNITY)
Admission: RE | Admit: 2019-12-03 | Discharge: 2019-12-03 | Disposition: A | Discharge status: Home / Self Care | Payer: BC Managed Care – PPO | Source: Ambulatory Visit | Attending: Certified Nurse Midwife | Admitting: Certified Nurse Midwife

## 2019-12-03 ENCOUNTER — Ambulatory Visit (INDEPENDENT_AMBULATORY_CARE_PROVIDER_SITE_OTHER): Payer: BC Managed Care – PPO | Admitting: Certified Nurse Midwife

## 2019-12-03 ENCOUNTER — Encounter: Payer: Self-pay | Admitting: Certified Nurse Midwife

## 2019-12-03 ENCOUNTER — Other Ambulatory Visit: Payer: Self-pay

## 2019-12-03 VITALS — BP 118/87 | HR 101 | Ht 63.0 in | Wt 144.0 lb

## 2019-12-03 DIAGNOSIS — N898 Other specified noninflammatory disorders of vagina: Secondary | ICD-10-CM | POA: Insufficient documentation

## 2019-12-03 DIAGNOSIS — Z01419 Encounter for gynecological examination (general) (routine) without abnormal findings: Secondary | ICD-10-CM

## 2019-12-03 DIAGNOSIS — R7989 Other specified abnormal findings of blood chemistry: Secondary | ICD-10-CM | POA: Diagnosis not present

## 2019-12-03 DIAGNOSIS — Z113 Encounter for screening for infections with a predominantly sexual mode of transmission: Secondary | ICD-10-CM

## 2019-12-03 DIAGNOSIS — Z23 Encounter for immunization: Secondary | ICD-10-CM | POA: Diagnosis not present

## 2019-12-03 DIAGNOSIS — Z124 Encounter for screening for malignant neoplasm of cervix: Secondary | ICD-10-CM

## 2019-12-03 DIAGNOSIS — Z3041 Encounter for surveillance of contraceptive pills: Secondary | ICD-10-CM

## 2019-12-03 DIAGNOSIS — Z1231 Encounter for screening mammogram for malignant neoplasm of breast: Secondary | ICD-10-CM

## 2019-12-03 MED ORDER — TETANUS-DIPHTH-ACELL PERTUSSIS 5-2.5-18.5 LF-MCG/0.5 IM SUSP
0.5000 mL | Freq: Once | INTRAMUSCULAR | Status: AC
  Administered 2019-12-03: 0.5 mL via INTRAMUSCULAR

## 2019-12-03 NOTE — Progress Notes (Signed)
ANNUAL PREVENTATIVE CARE GYN  ENCOUNTER NOTE  Subjective:       Joanna Walker is a 44 y.o.  female here for a routine annual gynecologic exam.  Current complaints: 1.  Birth Control Refill 2.  STI Screening 3.  Requests mammogram  Patient transferring from [facility]  Patient doing well. Believes pap was last year but is unsure. Would like to complete today.   Adopted and is unsure about her family history. Started getting yearly mammograms in her thirties.   Denies difficulty breathing or respiratory distress, chest pain, abdominal pain, excessive vaginal bleeding, dysuria, leg pain or swelling  Gynecologic History  No LMP recorded (lmp unknown). (Menstrual status: Oral contraceptives).   Contraception: OCP (estrogen/progesterone)   Last Pap: 06/28/2013. Results were: normal  Last mammogram: 11/03/13. Results were: BI-RADS 1 CAT.: NEGATIVE  Past Medical History:  Diagnosis Date   ADD (attention deficit disorder)    Allergy    Anxiety    Asthma    Depression    Thyroid disease     Past Surgical History:  Procedure Laterality Date   CHOLECYSTECTOMY     CHOLECYSTECTOMY      Current Outpatient Medications on File Prior to Visit  Medication Sig Dispense Refill   albuterol (VENTOLIN HFA) 108 (90 Base) MCG/ACT inhaler Inhale into the lungs.     fluticasone (FLONASE) 50 MCG/ACT nasal spray TWO PUFFS IN EACH NOSTRIL ONCE A DAY 16 g 1   Multiple Vitamins-Minerals (MULTIVITAMIN PO) Take 1 tablet by mouth daily.     Omega-3 Fatty Acids (FISH OIL) 1000 MG CAPS Take 1 capsule by mouth 2 (two) times daily.     vitamin C (ASCORBIC ACID) 500 MG tablet Take 500 mg by mouth 2 (two) times daily.     ALLEGRA-D ALLERGY & CONGESTION 180-240 MG 24 hr tablet Take 1 tablet by mouth daily.     clonazePAM (KLONOPIN) 1 MG tablet SMARTSIG:1 Tablet(s) By Mouth Every 12 Hours PRN     FLUoxetine (PROZAC) 40 MG capsule Take 40 mg by mouth daily.     gabapentin  (NEURONTIN) 300 MG capsule Take 1 capsule (300 mg total) by mouth 3 (three) times daily. 30 capsule 0   gabapentin (NEURONTIN) 300 MG capsule Take by mouth.     naproxen (NAPROSYN) 500 MG tablet Take by mouth.     norethindrone-ethinyl estradiol (LOESTRIN) 1-20 MG-MCG tablet Take 1 tablet by mouth daily.     omeprazole (PRILOSEC) 20 MG capsule Take 20 mg by mouth daily.     polyethylene glycol powder (GLYCOLAX/MIRALAX) 17 GM/SCOOP powder Take by mouth.     topiramate (TOPAMAX) 50 MG tablet Take 50 mg by mouth 2 (two) times daily.     traZODone (DESYREL) 100 MG tablet      [DISCONTINUED] buPROPion (WELLBUTRIN XL) 150 MG 24 hr tablet Take 1 tablet (150 mg total) by mouth daily. 30 tablet 2   No current facility-administered medications on file prior to visit.    Allergies  Allergen Reactions   Morphine Nausea And Vomiting    Social History   Socioeconomic History   Marital status: Legally Separated    Spouse name: Not on file   Number of children: Not on file   Years of education: Not on file   Highest education level: Not on file  Occupational History   Not on file  Tobacco Use   Smoking status: Never Smoker   Smokeless tobacco: Never Used  Substance and Sexual Activity  Alcohol use: Yes    Comment: rare   Drug use: No   Sexual activity: Not on file  Other Topics Concern   Not on file  Social History Narrative   Not on file   Social Determinants of Health   Financial Resource Strain:    Difficulty of Paying Living Expenses:   Food Insecurity:    Worried About Charity fundraiser in the Last Year:    Arboriculturist in the Last Year:   Transportation Needs:    Film/video editor (Medical):    Lack of Transportation (Non-Medical):   Physical Activity:    Days of Exercise per Week:    Minutes of Exercise per Session:   Stress:    Feeling of Stress :   Social Connections:    Frequency of Communication with Friends and Family:     Frequency of Social Gatherings with Friends and Family:    Attends Religious Services:    Active Member of Clubs or Organizations:    Attends Music therapist:    Marital Status:   Intimate Partner Violence:    Fear of Current or Ex-Partner:    Emotionally Abused:    Physically Abused:    Sexually Abused:     Family History  Adopted: Yes    The following portions of the patient's history were reviewed and updated as appropriate: allergies, current medications, past family history, past medical history, past social history, past surgical history and problem list.  Review of Systems  ROS negative except as noted above. Information obtained from patient.    Objective:   BP 118/87    Pulse (!) 101    Ht 5\' 3"  (1.6 m)    Wt 144 lb (65.3 kg)    LMP  (LMP Unknown)    BMI 25.51 kg/m    CONSTITUTIONAL: Well-developed, well-nourished female in no acute distress.   PSYCHIATRIC: Normal mood and affect. Normal behavior. Normal judgment and thought content.  [place]: Alert and oriented to person, place, and time. Normal muscle tone coordination. No cranial nerve deficit noted.  HENT:  Normocephalic, atraumatic, External right and left ear normal. Oropharynx is clear and moist  EYES: Conjunctivae and EOM are normal. No scleral icterus.   NECK: Normal range of motion, supple, no masses.  Normal thyroid.   SKIN: Skin is warm and dry. No rash noted. Not diaphoretic. No erythema. No pallor.  CARDIOVASCULAR: Normal heart rate noted, regular rhythm, no murmur.  RESPIRATORY: Clear to auscultation bilaterally. Effort and breath sounds normal, no problems with respiration noted.  BREASTS: Symmetric in size. No masses, skin changes, nipple drainage, or lymphadenopathy.  ABDOMEN: Soft, normal bowel sounds, no distention noted.  No tenderness, rebound or guarding.   PELVIC:  External Genitalia: Normal  Vagina: Normal  Cervix: Normal  Uterus: Normal  Adnexa:  Normal   MUSCULOSKELETAL: Normal range of motion. No tenderness.  No cyanosis, clubbing, or edema.  2+ distal pulses.  LYMPHATIC: No Axillary, Supraclavicular, or Inguinal Adenopathy.  Assessment:   Annual gynecologic examination 44 y.o.   Contraception: OCP (estrogen/progesterone)   Normal BMI   Problem List Items Addressed This Visit    None    Visit Diagnoses    Well woman exam with routine gynecological exam    -  Primary   Relevant Orders   Hepatitis C Antibody   HIV Antibody (routine testing w rflx)   RPR   Cytology - PAP   MM 3D SCREEN BREAST BILATERAL  CBC with Differential/Platelet   TSH   Lipid panel   Comprehensive metabolic panel   Routine screening for STI (sexually transmitted infection)       Relevant Orders   Hepatitis C Antibody   HIV Antibody (routine testing w rflx)   RPR   Screening for cervical cancer       Relevant Orders   Cytology - PAP   Screening mammogram, encounter for       Relevant Orders   MM 3D SCREEN BREAST BILATERAL   Vaginal discharge       Relevant Orders   Cervicovaginal ancillary only   Abnormal thyroid blood test       Relevant Orders   Cervicovaginal ancillary only   Comprehensive metabolic panel   Surveillance for birth control, oral contraceptives       Need for immunization with diphtheria, tetanus, and poliovirus vaccine          Plan:   Pap: Pap Co Test.  Mammogram: Ordered.  Labs: See Orders.  Routine preventative health maintenance measures emphasized: Exercise/Diet/Weight control, Tobacco Warnings, Alcohol/Substance use risks and Stress Management. See AVS.  Rx Loestrin to be sent when e-scripts information provided.   Reviewed red flag symptoms and when to call the office.  RTC x 1 year for ANNUAL EXAM or sooner if needed   [employer] RN [facility] [employer] 12/03/19 11:08 AM

## 2019-12-03 NOTE — Patient Instructions (Signed)
Mammogram A mammogram is an X-ray of the breasts that is done to check for changes that are not normal. This test can screen for and find any changes that may suggest breast cancer. Mammograms are regularly done on women. A man may have a mammogram if he has a lump or swelling in his breast. This test can also help to find other changes and variations in the breast. Tell a doctor:  About any allergies you have.  If you have breast implants.  If you have had previous breast disease, biopsy, or surgery.  If you are breastfeeding.  If you are younger than age 2.  If you have a family history of breast cancer.  Whether you are pregnant or may be pregnant. What are the risks? Generally, this is a safe procedure. However, problems may occur, including:  Exposure to radiation. Radiation levels are very low with this test.  The results being misinterpreted.  The need for further tests.  The inability of the mammogram to detect certain cancers. What happens before the procedure?  Have this test done about 1-2 weeks after your period. This is usually when your breasts are the least tender.  If you are visiting a new doctor or clinic, send any past mammogram images to your new doctor's office.  Wash your breasts and under your arms the day of the test.  Do not use deodorants, perfumes, lotions, or powders on the day of the test.  Take off any jewelry from your neck.  Wear clothes that you can change into and out of easily. What happens during the procedure?   You will undress from the waist up. You will put on a gown.  You will stand in front of the X-ray machine.  Each breast will be placed between two plastic or glass plates. The plates will press down on your breast for a few seconds. Try to stay as relaxed as possible. This does not cause any harm to your breasts. Any discomfort you feel will be very brief.  X-rays will be taken from different angles of each breast. The  procedure may vary among doctors and hospitals. What happens after the procedure?  The mammogram will be read by a specialist (radiologist).  You may need to do certain parts of the test again. This depends on the quality of the images.  Ask when your test results will be ready. Make sure you get your test results.  You may go back to your normal activities. Summary  A mammogram is a low energy X-ray of the breasts that is done to check for abnormal changes. A man may have this test if he has a lump or swelling in his breast.  Before the procedure, tell your doctor about any breast problems that you have had in the past.  Have this test done about 1-2 weeks after your period.  For the test, each breast will be placed between two plastic or glass plates. The plates will press down on your breast for a few seconds.  The mammogram will be read by a specialist (radiologist). Ask when your test results will be ready. Make sure you get your test results. This information is not intended to replace advice given to you by your health care provider. Make sure you discuss any questions you have with your health care provider. Document Revised: 01/22/2018 Document Reviewed: 01/22/2018 Elsevier Patient Education  2020 [place] 75-44 Years Old, Female Preventive care refers to visits  with your health care provider and lifestyle choices that can promote health and wellness. This includes:  A yearly physical exam. This may also be called an annual well check.  Regular dental visits and eye exams.  Immunizations.  Screening for certain conditions.  Healthy lifestyle choices, such as eating a healthy diet, getting regular exercise, not using drugs or products that contain nicotine and tobacco, and limiting alcohol use. What can I expect for my preventive care visit? Physical exam Your health care provider will check your:  Height and weight. This may be used to  calculate body mass index (BMI), which tells if you are at a healthy weight.  Heart rate and blood pressure.  Skin for abnormal spots. Counseling Your health care provider may ask you questions about your:  Alcohol, tobacco, and drug use.  Emotional well-being.  Home and relationship well-being.  Sexual activity.  Eating habits.  Work and work Statistician.  Method of birth control.  Menstrual cycle.  Pregnancy history. What immunizations do I need?  Influenza (flu) vaccine  This is recommended every year. Tetanus, diphtheria, and pertussis (Tdap) vaccine  You may need a Td booster every 10 years. Varicella (chickenpox) vaccine  You may need this if you have not been vaccinated. Human papillomavirus (HPV) vaccine  If recommended by your health care provider, you may need three doses over 6 months. Measles, mumps, and rubella (MMR) vaccine  You may need at least one dose of MMR. You may also need a second dose. Meningococcal conjugate (MenACWY) vaccine  One dose is recommended if you are age 20-21 years and a first-year college student living in a residence hall, or if you have one of several medical conditions. You may also need additional booster doses. Pneumococcal conjugate (PCV13) vaccine  You may need this if you have certain conditions and were not previously vaccinated. Pneumococcal polysaccharide (PPSV23) vaccine  You may need one or two doses if you smoke cigarettes or if you have certain conditions. Hepatitis A vaccine  You may need this if you have certain conditions or if you travel or work in places where you may be exposed to hepatitis A. Hepatitis B vaccine  You may need this if you have certain conditions or if you travel or work in places where you may be exposed to hepatitis B. Haemophilus influenzae type b (Hib) vaccine  You may need this if you have certain conditions. You may receive vaccines as individual doses or as more than one  vaccine together in one shot (combination vaccines). Talk with your health care provider about the risks and benefits of combination vaccines. What tests do I need?  Blood tests  Lipid and cholesterol levels. These may be checked every 5 years starting at age 38.  Hepatitis C test.  Hepatitis B test. Screening  Diabetes screening. This is done by checking your blood sugar (glucose) after you have not eaten for a while (fasting).  Sexually transmitted disease (STD) testing.  BRCA-related cancer screening. This may be done if you have a family history of breast, ovarian, tubal, or peritoneal cancers.  Pelvic exam and Pap test. This may be done every 3 years starting at age 74. Starting at age 39, this may be done every 5 years if you have a Pap test in combination with an HPV test. Talk with your health care provider about your test results, treatment options, and if necessary, the need for more tests. Follow these instructions at home: Eating and drinking  Eat a diet that includes fresh fruits and vegetables, whole grains, lean protein, and low-fat dairy.  Take vitamin and mineral supplements as recommended by your health care provider.  Do not drink alcohol if: ? Your health care provider tells you not to drink. ? You are pregnant, may be pregnant, or are planning to become pregnant.  If you drink alcohol: ? Limit how much you have to 0-1 drink a day. ? Be aware of how much alcohol is in your drink. In the [place] one drink equals one 12 oz bottle of beer (355 mL), one 5 oz glass of wine (148 mL), or one 1 oz glass of hard liquor (44 mL). Lifestyle  Take daily care of your teeth and gums.  Stay active. Exercise for at least 30 minutes on 5 or more days each week.  Do not use any products that contain nicotine or tobacco, such as cigarettes, e-cigarettes, and chewing tobacco. If you need help quitting, ask your health care provider.  If you are sexually active, practice safe  sex. Use a condom or other form of birth control (contraception) in order to prevent pregnancy and STIs (sexually transmitted infections). If you plan to become pregnant, see your health care provider for a preconception visit. What's next?  Visit your health care provider once a year for a well check visit.  Ask your health care provider how often you should have your eyes and teeth checked.  Stay up to date on all vaccines. This information is not intended to replace advice given to you by your health care provider. Make sure you discuss any questions you have with your health care provider. Document Revised: 02/12/2018 Document Reviewed: 02/12/2018 Elsevier Patient Education  2020 [employer].

## 2019-12-03 NOTE — Progress Notes (Signed)
I have seen, interviewed, and examined the patient in conjunction with the [employer] Nurse Practitioner student and affirm the diagnosis and management plan.   Gunnar Bulla, CNM [facility], [facility] 12/03/19 1:59 PM

## 2019-12-04 LAB — RPR: RPR Ser Ql: NONREACTIVE

## 2019-12-04 LAB — HIV ANTIBODY (ROUTINE TESTING W REFLEX): HIV Screen 4th Generation wRfx: NONREACTIVE

## 2019-12-04 LAB — HEPATITIS C ANTIBODY: Hep C Virus Ab: <0.1 s/co ratio (ref 0.0–0.9)

## 2019-12-06 ENCOUNTER — Other Ambulatory Visit (INDEPENDENT_AMBULATORY_CARE_PROVIDER_SITE_OTHER): Payer: BC Managed Care – PPO | Admitting: Certified Nurse Midwife

## 2019-12-06 DIAGNOSIS — Z3041 Encounter for surveillance of contraceptive pills: Secondary | ICD-10-CM

## 2019-12-06 MED ORDER — NORETHINDRONE ACET-ETHINYL EST 1-20 MG-MCG PO TABS: 1.0000 | ORAL_TABLET | Freq: Every day | ORAL | 11 refills | Status: DC

## 2019-12-06 NOTE — Progress Notes (Signed)
Rx Loestrin, see orders.   Serafina Royals, CNM [facility], [facility] 12/06/19 10:34 AM

## 2019-12-07 LAB — CERVICOVAGINAL ANCILLARY ONLY
Bacterial Vaginitis (gardnerella): POSITIVE — AB
Candida Glabrata: NEGATIVE
Candida Vaginitis: NEGATIVE
Comment: NEGATIVE
Comment: NEGATIVE
Comment: NEGATIVE

## 2019-12-08 ENCOUNTER — Other Ambulatory Visit: Payer: Self-pay | Admitting: Certified Nurse Midwife

## 2019-12-08 ENCOUNTER — Other Ambulatory Visit (INDEPENDENT_AMBULATORY_CARE_PROVIDER_SITE_OTHER): Payer: BC Managed Care – PPO | Admitting: Certified Nurse Midwife

## 2019-12-08 DIAGNOSIS — N760 Acute vaginitis: Secondary | ICD-10-CM

## 2019-12-08 DIAGNOSIS — B9689 Other specified bacterial agents as the cause of diseases classified elsewhere: Secondary | ICD-10-CM

## 2019-12-08 MED ORDER — METRONIDAZOLE 500 MG PO TABS: 500.0000 mg | ORAL_TABLET | Freq: Two times a day (BID) | ORAL | 0 refills | Status: AC

## 2019-12-08 MED ORDER — METRONIDAZOLE 500 MG PO TABS
500.0000 mg | ORAL_TABLET | Freq: Two times a day (BID) | ORAL | 0 refills | Status: DC
Start: 2019-12-08 — End: 2019-12-08

## 2019-12-08 NOTE — Progress Notes (Signed)
Rx Flagyl, see orders.   Serafina Royals, CNM [facility], [facility] 12/08/19 4:41 PM

## 2019-12-08 NOTE — Progress Notes (Signed)
Pharmacy updated.   Joanna Walker, CNM [facility], [facility] 12/08/19 4:49 PM

## 2019-12-15 LAB — CYTOLOGY - PAP
Chlamydia: NEGATIVE
Comment: NEGATIVE
Comment: NEGATIVE
Comment: NEGATIVE
Comment: NORMAL
Diagnosis: UNDETERMINED — AB
HPV 16: POSITIVE — AB
HPV 18 / 45: NEGATIVE
High risk HPV: POSITIVE — AB
Neisseria Gonorrhea: NEGATIVE

## 2019-12-24 NOTE — Telephone Encounter (Signed)
lmtrc to schedule colpo.

## 2019-12-24 NOTE — Telephone Encounter (Signed)
-----   Message from Gunnar Bulla, CNM sent at 12/23/2019  5:09 PM EDT ----- Please contact patient to schedule colposcopy. Thanks, JML

## 2020-01-05 ENCOUNTER — Other Ambulatory Visit: Payer: Self-pay

## 2020-01-05 ENCOUNTER — Encounter: Payer: Self-pay | Admitting: Obstetrics and Gynecology

## 2020-01-05 ENCOUNTER — Ambulatory Visit (INDEPENDENT_AMBULATORY_CARE_PROVIDER_SITE_OTHER): Payer: BC Managed Care – PPO | Admitting: Obstetrics and Gynecology

## 2020-01-05 ENCOUNTER — Other Ambulatory Visit (HOSPITAL_COMMUNITY)
Admission: RE | Admit: 2020-01-05 | Discharge: 2020-01-05 | Disposition: A | Discharge status: Home / Self Care | Payer: BC Managed Care – PPO | Source: Ambulatory Visit | Attending: Obstetrics and Gynecology | Admitting: Obstetrics and Gynecology

## 2020-01-05 VITALS — BP 118/79 | HR 93 | Ht 63.0 in | Wt 141.8 lb

## 2020-01-05 DIAGNOSIS — R87610 Atypical squamous cells of undetermined significance on cytologic smear of cervix (ASC-US): Secondary | ICD-10-CM | POA: Insufficient documentation

## 2020-01-05 DIAGNOSIS — B977 Papillomavirus as the cause of diseases classified elsewhere: Secondary | ICD-10-CM | POA: Diagnosis present

## 2020-01-05 DIAGNOSIS — N72 Inflammatory disease of cervix uteri: Secondary | ICD-10-CM

## 2020-01-05 NOTE — Addendum Note (Signed)
Addended by: Dorian Pod on: 01/05/2020 10:45 AM   Modules accepted: Orders

## 2020-01-05 NOTE — Progress Notes (Signed)
Referring Provider: Jeralyn Bennett  HPI:  Joanna Walker is a 44 y.o.  No obstetric history on file.  who presents today for evaluation and management of abnormal cervical cytology.    Dysplasia History:  This is her first abnormal    ASCUS -positive HPV (type 16)      ROS:  Pertinent items are noted in HPI.  -Patient does not smoke  OB History  No obstetric history on file.    Past Medical History:  Diagnosis Date  . ADD (attention deficit disorder)   . Allergy   . Anxiety   . Asthma   . Depression   . Thyroid disease     Past Surgical History:  Procedure Laterality Date  . CHOLECYSTECTOMY    . CHOLECYSTECTOMY      SOCIAL HISTORY: Social History   Substance and Sexual Activity  Alcohol Use Yes   Comment: rare   Social History   Substance and Sexual Activity  Drug Use No     Family History  Adopted: Yes    ALLERGIES:  Morphine  She has a current medication list which includes the following prescription(s): albuterol, allegra-d allergy & congestion, clonazepam, fluoxetine, fluticasone, gabapentin, naproxen, norethindrone-ethinyl estradiol, omeprazole, polyethylene glycol powder, topiramate, trazodone, gabapentin, multiple vitamin, fish oil, vitamin c, and [DISCONTINUED] bupropion.  Physical Exam: -Vitals:  BP 118/79   Pulse 93   Ht 5\' 3"  (1.6 m)   Wt 141 lb 12.8 oz (64.3 kg)   BMI 25.12 kg/m   PROCEDURE: Colposcopy performed with 4% acetic acid after verbal consent obtained                           -Aceto-white Lesions Location(s): 11 into o'clock.              -Biopsy performed at 11 and 2 o'clock               -ECC indicated and performed: No.     -Biopsy sites made hemostatic with pressure and Monsel's solution   -Satisfactory colposcopy: Yes.      -Evidence of Invasive cervical CA :  NO  ASSESSMENT:  Joanna Walker is a 44 y.o. No obstetric history on file. here for No diagnosis found.59  PLAN: 1.  I discussed the grading system of pap  smears and HPV high risk viral types.  We will discuss management after colpo results return.  No orders of the defined types were placed in this encounter.          F/U  Return in about 2 weeks (around 01/19/2020) for Colpo f/u. I spent 15 minutes involved in the care of this patient preparing to see the patient by obtaining and reviewing her medical history (including labs, imaging tests and prior procedures), documenting clinical information in the electronic health record (EHR), counseling and coordinating care plans, writing and sending prescriptions, ordering tests or procedures and directly communicating with the patient by discussing pertinent items from her history and physical exam as well as detailing my assessment and plan as noted above so that she has an informed understanding.  All of her questions were answered.  03/20/2020 ,MD 01/05/2020,8:56 AM

## 2020-01-06 LAB — SURGICAL PATHOLOGY

## 2020-01-19 ENCOUNTER — Encounter: Payer: Self-pay | Admitting: Obstetrics and Gynecology

## 2020-01-19 ENCOUNTER — Ambulatory Visit (INDEPENDENT_AMBULATORY_CARE_PROVIDER_SITE_OTHER): Payer: BC Managed Care – PPO | Admitting: Obstetrics and Gynecology

## 2020-01-19 VITALS — BP 131/88 | HR 99 | Ht 63.0 in | Wt 142.3 lb

## 2020-01-19 DIAGNOSIS — N72 Inflammatory disease of cervix uteri: Secondary | ICD-10-CM | POA: Diagnosis not present

## 2020-01-19 DIAGNOSIS — B9689 Other specified bacterial agents as the cause of diseases classified elsewhere: Secondary | ICD-10-CM

## 2020-01-19 DIAGNOSIS — N760 Acute vaginitis: Secondary | ICD-10-CM

## 2020-01-19 DIAGNOSIS — B977 Papillomavirus as the cause of diseases classified elsewhere: Secondary | ICD-10-CM

## 2020-01-19 NOTE — Progress Notes (Signed)
HPI:      [name] is a 44 y.o. No obstetric history on file. who LMP was No LMP recorded. (Menstrual status: Oral contraceptives).  Subjective:   She presents today to follow-up her colposcopy.  Her Pap previously revealed ASCUS with positive type 16 HPV. Her colposcopy revealed no cytologic abnormalities. Patient states that she again has symptoms of BV-recently treated but now has the same vaginal discharge mild irritation and odor.  She would like to be retreated. She continues to vape but says that she is soon going to quit. She has concerns regarding her mammogram which she gets every year and this gives her some comfort to know things are normal. She is concerned regarding her family history and the possibility of inheritable conditions.  She states that she has had genetic testing in the past.    Hx: The following portions of the patient's history were reviewed and updated as appropriate:             She  has a past medical history of ADD (attention deficit disorder), Allergy, Anxiety, Asthma, Depression, and Thyroid disease. She does not have any pertinent problems on file. She  has a past surgical history that includes Cholecystectomy and Cholecystectomy. Her family history is not on file. She was adopted. She  reports that she has never smoked. She has never used smokeless tobacco. She reports current alcohol use. She reports that she does not use drugs. She has a current medication list which includes the following prescription(s): albuterol, allegra-d allergy & congestion, clonazepam, fluoxetine, fluticasone, gabapentin, gabapentin, multiple vitamin, naproxen, norethindrone-ethinyl estradiol, fish oil, omeprazole, polyethylene glycol powder, topiramate, trazodone, vitamin c, and [DISCONTINUED] bupropion. She is allergic to morphine.       Review of Systems:  Review of Systems  Constitutional: Denied constitutional symptoms, night sweats, recent illness, fatigue,  fever, insomnia and weight loss.  Eyes: Denied eye symptoms, eye pain, photophobia, vision change and visual disturbance.  Ears/Nose/Throat/Neck: Denied ear, nose, throat or neck symptoms, hearing loss, nasal discharge, sinus congestion and sore throat.  Cardiovascular: Denied cardiovascular symptoms, arrhythmia, chest pain/pressure, edema, exercise intolerance, orthopnea and palpitations.  Respiratory: Denied pulmonary symptoms, asthma, pleuritic pain, productive sputum, cough, dyspnea and wheezing.  Gastrointestinal: Denied, gastro-esophageal reflux, melena, nausea and vomiting.  Genitourinary: Denied genitourinary symptoms including symptomatic vaginal discharge, pelvic relaxation issues, and urinary complaints.  Musculoskeletal: Denied musculoskeletal symptoms, stiffness, swelling, muscle weakness and myalgia.  Dermatologic: Denied dermatology symptoms, rash and scar.  Neurologic: Denied neurology symptoms, dizziness, headache, neck pain and syncope.  Psychiatric: Denied psychiatric symptoms, anxiety and depression.  Endocrine: Denied endocrine symptoms including hot flashes and night sweats.   Meds:   Current Outpatient Medications on File Prior to Visit  Medication Sig Dispense Refill  . albuterol (VENTOLIN HFA) 108 (90 Base) MCG/ACT inhaler Inhale into the lungs.    Elvera Maria ALLERGY & CONGESTION 180-240 MG 24 hr tablet Take 1 tablet by mouth daily.    . clonazePAM (KLONOPIN) 1 MG tablet SMARTSIG:1 Tablet(s) By Mouth Every 12 Hours PRN    . FLUoxetine (PROZAC) 40 MG capsule Take 40 mg by mouth daily.    . fluticasone (FLONASE) 50 MCG/ACT nasal spray TWO PUFFS IN EACH NOSTRIL ONCE A DAY 16 g 1  . gabapentin (NEURONTIN) 300 MG capsule Take 1 capsule (300 mg total) by mouth 3 (three) times daily. 30 capsule 0  . gabapentin (NEURONTIN) 300 MG capsule Take by mouth.    . Multiple Vitamins-Minerals (MULTIVITAMIN PO)  Take 1 tablet by mouth daily. (Patient not taking: Reported on 01/05/2020)     . naproxen (NAPROSYN) 500 MG tablet Take by mouth.    . norethindrone-ethinyl estradiol (LOESTRIN) 1-20 MG-MCG tablet Take 1 tablet by mouth daily. 28 tablet 11  . Omega-3 Fatty Acids (FISH OIL) 1000 MG CAPS Take 1 capsule by mouth 2 (two) times daily. (Patient not taking: Reported on 01/05/2020)    . omeprazole (PRILOSEC) 20 MG capsule Take 20 mg by mouth daily.    . polyethylene glycol powder (GLYCOLAX/MIRALAX) 17 GM/SCOOP powder Take by mouth.    . topiramate (TOPAMAX) 50 MG tablet Take 50 mg by mouth 2 (two) times daily.    . traZODone (DESYREL) 100 MG tablet     . vitamin C (ASCORBIC ACID) 500 MG tablet Take 500 mg by mouth 2 (two) times daily. (Patient not taking: Reported on 01/05/2020)    . [DISCONTINUED] buPROPion (WELLBUTRIN XL) 150 MG 24 hr tablet Take 1 tablet (150 mg total) by mouth daily. 30 tablet 2   No current facility-administered medications on file prior to visit.    Objective:     Vitals:   01/19/20 1359  BP: 131/88  Pulse: 99              Colpo results reviewed directly with the patient  Assessment:    No obstetric history on file. Patient Active Problem List   Diagnosis Date Noted  . Chest pain 10/20/2013  . Allergic rhinitis 09/28/2013  . Positive urine drug screen 08/16/2013  . ADD (attention deficit disorder) 02/23/2013  . Migraine with aura 01/13/2013  . Anxiety and depression   . Allergy      1. High risk human papilloma virus (HPV) infection of cervix   2. Bacterial vulvovaginitis        Plan:            1.  Natural course and history of HPV and cervical cytology discussed in detail with the patient.  Per ASCCP guidelines recommend follow-up Co. test 1 year based future colposcopy on that Pap.  I discussed this with her in detail  2.  Effect of smoking/vaping on cervical abnormalities discussed patient advised to stop vaping.  She says she will do this in the near future it is not "permanent".  3.  Patient was interested in discussing  mammography and we have discussed the recommendations of ACOG, Glass blower/designer, and [employer].  4.  We will treat for BV based on patient's recent history of BV and symptom pattern.  If she does not improve patient will need further work-up.  5.  Genetics of inheritable cancers discussed in detail.  All questions answered.  6.  Covid vaccination (patient remains unvaccinated) discussed.  Risks and benefits reviewed.  Types of different vaccines reviewed. Orders No orders of the defined types were placed in this encounter.   No orders of the defined types were placed in this encounter.     F/U  Return in about 1 year (around 01/18/2021) for Annual Physical. I spent 33 minutes involved in the care of this patient preparing to see the patient by obtaining and reviewing her medical history (including labs, imaging tests and prior procedures), documenting clinical information in the electronic health record (EHR), counseling and coordinating care plans, writing and sending prescriptions, ordering tests or procedures and directly communicating with the patient by discussing pertinent items from her history and physical exam as well as detailing my assessment and  plan as noted above so that she has an informed understanding.  All of her questions were answered.  Elonda Husky, M.D. 01/19/2020 2:46 PM

## 2020-01-20 ENCOUNTER — Other Ambulatory Visit: Payer: Self-pay | Admitting: Surgical

## 2020-01-20 MED ORDER — METRONIDAZOLE 500 MG PO TABS
500.0000 mg | ORAL_TABLET | Freq: Two times a day (BID) | ORAL | 0 refills | Status: DC
Start: 2020-01-20 — End: 2022-04-27

## 2020-01-27 ENCOUNTER — Other Ambulatory Visit: Payer: Self-pay

## 2020-01-27 MED ORDER — NORETHINDRONE ACET-ETHINYL EST 1-20 MG-MCG PO TABS: 1.0000 | ORAL_TABLET | Freq: Every day | ORAL | 1 refills | Status: DC

## 2020-03-21 ENCOUNTER — Other Ambulatory Visit: Payer: Self-pay

## 2020-03-21 MED ORDER — NORETHINDRONE ACET-ETHINYL EST 1-20 MG-MCG PO TABS: 1.0000 | ORAL_TABLET | Freq: Every day | ORAL | 2 refills | Status: DC
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
## 2020-09-06 ENCOUNTER — Other Ambulatory Visit: Payer: Self-pay | Admitting: Surgical

## 2020-09-06 MED ORDER — NORETHINDRONE ACET-ETHINYL EST 1-20 MG-MCG PO TABS: 1.0000 | ORAL_TABLET | Freq: Every day | ORAL | 0 refills | Status: DC

## 2020-09-07 ENCOUNTER — Encounter: Payer: Self-pay | Admitting: Psychiatry

## 2020-09-07 ENCOUNTER — Other Ambulatory Visit: Payer: Self-pay

## 2020-09-07 ENCOUNTER — Telehealth (INDEPENDENT_AMBULATORY_CARE_PROVIDER_SITE_OTHER): Payer: Commercial Managed Care - HMO | Admitting: Psychiatry

## 2020-09-07 DIAGNOSIS — F0630 Mood disorder due to known physiological condition, unspecified: Secondary | ICD-10-CM | POA: Diagnosis not present

## 2020-09-07 MED ORDER — ARIPIPRAZOLE 2 MG PO TABS: 2.0000 mg | ORAL_TABLET | Freq: Every day | ORAL | 0 refills | Status: DC

## 2020-09-20 ENCOUNTER — Other Ambulatory Visit: Payer: Self-pay | Admitting: Certified Nurse Midwife

## 2020-10-05 ENCOUNTER — Encounter: Payer: Self-pay | Admitting: Psychiatry

## 2020-10-05 ENCOUNTER — Telehealth (INDEPENDENT_AMBULATORY_CARE_PROVIDER_SITE_OTHER): Payer: Commercial Managed Care - HMO | Admitting: Psychiatry

## 2020-10-05 ENCOUNTER — Other Ambulatory Visit: Payer: Self-pay

## 2020-10-05 DIAGNOSIS — F4310 Post-traumatic stress disorder, unspecified: Secondary | ICD-10-CM

## 2020-10-05 DIAGNOSIS — F330 Major depressive disorder, recurrent, mild: Secondary | ICD-10-CM

## 2020-10-05 MED ORDER — FLUOXETINE HCL 20 MG PO CAPS: 60.0000 mg | ORAL_CAPSULE | Freq: Every day | ORAL | 0 refills | Status: DC

## 2020-10-05 MED ORDER — ARIPIPRAZOLE 2 MG PO TABS: 2.0000 mg | ORAL_TABLET | Freq: Every day | ORAL | 0 refills | Status: DC

## 2020-10-05 NOTE — Patient Instructions (Signed)
1 Increase fluoxeitne 60 mg daily  2.Continue Abilify 2 mg daily  3. Next appointment:5/18 at 2:30

## 2020-10-05 NOTE — Progress Notes (Signed)
Virtual Visit via Video Note  I connected with Joanna Walker on 10/05/20 at  3:45 PM EDT by a video enabled telemedicine application and verified that I am speaking with the correct person using two identifiers.  Location: Patient: home Provider: office Persons participated in the visit- patient, provider   I discussed the limitations of evaluation and management by telemedicine and the availability of in person appointments. The patient expressed understanding and agreed to proceed.   I discussed the assessment and treatment plan with the patient. The patient was provided an opportunity to ask questions and all were answered. The patient agreed with the plan and demonstrated an understanding of the instructions.   The patient was advised to call back or seek an in-person evaluation if the symptoms worsen or if the condition fails to improve as anticipated.  I provided 25 minutes of non-face-to-face time during this encounter.   Joanna Hotter, MD     [facility] MD/PA/NP OP Progress Note  10/05/2020 4:23 PM Joanna Walker  MRN:  [id number]  Chief Complaint:  Chief Complaint    Follow-up; Depression     HPI:  This is a follow-up appointment for depression.  She states that she had to leave the job 3 weeks ago.  She was told that she was not right fit.  She states that she feels failing.  She states that it also affects her trust issues, which she has been already struggling with.  She refers to being adopted, and past experiences.  She agrees that she may not even trust herself.  She is trying to have another job.  She reports good relationship with her mother, who she contacts every other day.  She has been staying busy by doing remodeling and aggression of the house.  She enjoys time with her boyfriend, going to the beach, and riding a motorcycle.  Although she feels down at times, it has been getting better.  She has occasional issues with sleep.  She snores at night.  She feels  anxious and tense.  She believes she feels more even keeled since starting Abilify.  She denies decreased need for sleep or euphonia, impulsive shopping.  She has nightmares, flashback and hypervigilance.  She drinks a glass of wine or mixed drink every week or less.  She denies any drug use.   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 PTSD (post-traumatic stress disorder)  F43.10   2. Mild episode of recurrent major depressive disorder (HCC)  F33.0     Past Psychiatric History: Please see initial evaluation for full details. I have reviewed the history. No updates at this time.     Past Medical History:  Past Medical History:  Diagnosis Date  . ADD (attention deficit disorder)   . Allergy   . Anxiety   . Asthma   . Depression   . Thyroid disease     Past Surgical History:  Procedure Laterality Date  . CHOLECYSTECTOMY    . CHOLECYSTECTOMY      Family Psychiatric History: Please see initial evaluation for full details. I have reviewed the history. No updates at this time.  Family History:  Family History  Adopted: Yes    Social History:  Social History   Socioeconomic History  . Marital status: Legally Separated    Spouse name: Not on file  . Number of children: Not on file  . Years of education: Not on file  . Highest education level: Not on file  Occupational History  . Not on file  Tobacco Use  . Smoking status: Never Smoker  . Smokeless tobacco: Never Used  Substance and Sexual Activity  . Alcohol use: Yes    Comment: rare  . Drug use: No  . Sexual activity: Not on file  Other Topics Concern  . Not  on file  Social History Narrative  . Not on file   Social Determinants of Health   Financial Resource Strain: Not on file  Food Insecurity: Not on file  Transportation Needs: Not on file  Physical Activity: Not on file  Stress: Not on file  Social Connections: Not on file    Allergies:  Allergies  Allergen Reactions  . Morphine Nausea And Vomiting    Metabolic Disorder Labs: No results found for: HGBA1C, MPG No results found for: PROLACTIN Lab Results  Component Value Date   CHOL 185 06/28/2013   TRIG 221.0 (H) 06/28/2013   HDL 44.40 06/28/2013   CHOLHDL 4 06/28/2013   VLDL 44.2 (H) 06/28/2013   Lab Results  Component Value Date   TSH 0.85 06/28/2013    Therapeutic Level Labs: No results found for: LITHIUM No results found for: VALPROATE No components found for:  CBMZ  Current Medications: Current Outpatient Medications  Medication Sig Dispense Refill  . albuterol (VENTOLIN HFA) 108 (90 Base) MCG/ACT inhaler Inhale into the lungs.    Elvera Maria ALLERGY & CONGESTION 180-240 MG 24 hr tablet Take 1 tablet by mouth daily.    . ARIPiprazole (ABILIFY) 2 MG tablet Take 1 tablet (2 mg total) by mouth daily. 30 tablet 0  . clonazePAM (KLONOPIN) 1 MG tablet SMARTSIG:1 Tablet(s) By Mouth Every 12 Hours PRN    . FLUoxetine (PROZAC) 40 MG capsule Take 40 mg by mouth daily.    . fluticasone (FLONASE) 50 MCG/ACT nasal spray TWO PUFFS IN EACH NOSTRIL ONCE A DAY 16 g 1  . gabapentin (NEURONTIN) 300 MG capsule Take by mouth.    . metroNIDAZOLE (FLAGYL) 500 MG tablet Take 1 tablet (500 mg total) by mouth 2 (two) times daily. 14 tablet 0  . Multiple Vitamins-Minerals (MULTIVITAMIN PO) Take 1 tablet by mouth daily. (Patient not taking: Reported on 01/05/2020)    . naproxen (NAPROSYN) 500 MG tablet Take by mouth.    . norethindrone-ethinyl estradiol (LOESTRIN) 1-20 MG-MCG tablet TAKE 1 TABLET BY MOUTH DAILY 84 tablet 0  . Omega-3 Fatty Acids (FISH OIL) 1000 MG CAPS Take 1 capsule by  mouth 2 (two) times daily. (Patient not taking: Reported on 01/05/2020)    . omeprazole (PRILOSEC) 20 MG capsule Take 20 mg by mouth daily.    . polyethylene glycol powder (GLYCOLAX/MIRALAX) 17 GM/SCOOP powder Take by mouth.    . topiramate (TOPAMAX) 50 MG tablet Take 50 mg by mouth 2 (two) times daily.    . traZODone (DESYREL) 100 MG tablet     . vitamin C (ASCORBIC ACID) 500 MG tablet Take 500 mg by mouth 2 (two) times daily. (Patient not taking: Reported on 01/05/2020)     No current facility-administered medications for this visit.     Musculoskeletal: Strength & Muscle Tone: N/A Gait &  Station: N/A Patient leans: N/A  Psychiatric Specialty Exam: Review of Systems  Psychiatric/Behavioral: Positive for decreased concentration, dysphoric mood and sleep disturbance. Negative for agitation, behavioral problems, confusion, hallucinations, self-injury and suicidal ideas. The patient is nervous/anxious. The patient is not hyperactive.   All other systems reviewed and are negative.   There were no vitals taken for this visit.There is no height or weight on file to calculate BMI.  General Appearance: Fairly Groomed  Eye Contact:  Good  Speech:  Clear and Coherent  Volume:  Normal  Mood:  good  Affect:  Appropriate, Congruent and down at times  Thought Process:  Coherent  Orientation:  Full (Time, Place, and Person)  Thought Content: Logical   Suicidal Thoughts:  No  Homicidal Thoughts:  No  Memory:  Immediate;   Good  Judgement:  Good  Insight:  Good  Psychomotor Activity:  Normal  Concentration:  Concentration: Good and Attention Span: Good  Recall:  Good  Fund of Knowledge: Good  Language: Good  Akathisia:  No  Handed:  Right  AIMS (if indicated): not done  Assets:  Communication Skills Desire for Improvement  ADL's:  Intact  Cognition: WNL  Sleep:  Fair   Screenings: PHQ2-9   Flowsheet Row Video Visit from 10/05/2020 in [facility] Video  Visit from 09/07/2020 in [facility] Office Visit from 06/28/2013 in [facility] [place]  PHQ-2 Total Score 1 2 0  PHQ-9 Total Score -- 8 --    Flowsheet Row Video Visit from 09/07/2020 in [facility]  C-SSRS RISK CATEGORY No Risk       Assessment and Plan:  KINSLEE DALPE is a 45 y.o. year old female with a history of ADHD, bipolar disorder, migraine, who presents for follow up appointment for below.   Assessment  1. PTSD (post-traumatic stress disorder) 2. Mild episode of recurrent major depressive disorder (HCC), mixed features There has been overall improvement in depressive symptoms and subthreshold hypomanic symptoms of euphonia, decreased need for sleep, and impulsive shopping since starting Abilify.  Recent psychosocial stressors includes unemployment.  She has past trauma, and reports trust issues.  Will uptitrate fluoxetine to optimize treatment for PTSD and depression.  Will continue Abilify as adjunctive treatment for depression.  Noted that her subthreshold hypomanic symptoms are likely due to ineffective coping skills rather than under mind bipolar disorder; will continue to monitor.  She will greatly benefit from CBT; will make referral.   # Insomnia She reports srnogin, fatigue, and occasional insomnia.  Will make referral for evaluation of sleep apnea.   Plan 1 Increase fluoxeitne 60 mg daily  2.Continue Abilify 2 mg daily  3. Next appointment:5/18 at 2:30 for 30 mins, video 4. Referral to sleep apnea -  on adzenys 18.8, adderall xr 25, clonazepam 1 mg BID, prescribed by [place] attention specialist -  On gabapentin 300 mg TID, topiramate 50 mg for migraine - check TSH if that is not done at the next visit  I have reviewed suicide assessment in detail. No change in the following assessment.   The patient demonstrates the following risk factors for suicide: Chronic risk factors for suicide  include: psychiatric disorder of depression, anxiety. Acute risk factors for suicide include: loss (financial, interpersonal, professional). Protective factors for this patient include: positive therapeutic relationship, coping skills and hope for the future. Considering these factors, the overall suicide risk at this point appears to be low. Patient is appropriate for outpatient  follow up.   Joanna Hottereina Geramy Lamorte, MD 10/05/2020, 4:23 PM

## 2020-10-05 NOTE — Addendum Note (Signed)
Addended by: Neysa Hotter on: 10/05/2020 04:25 PM   Modules accepted: Orders

## 2020-10-25 NOTE — Progress Notes (Signed)
Virtual Visit via Video Note  I connected with Joanna Walker on 11/01/20 at  2:30 PM EDT by a video enabled telemedicine application and verified that I am speaking with the correct person using two identifiers.  Location: Patient: home Provider: office Persons participated in the visit- patient, provider   I discussed the limitations of evaluation and management by telemedicine and the availability of in person appointments. The patient expressed understanding and agreed to proceed.   I discussed the assessment and treatment plan with the patient. The patient was provided an opportunity to ask questions and all were answered. The patient agreed with the plan and demonstrated an understanding of the instructions.   The patient was advised to call back or seek an in-person evaluation if the symptoms worsen or if the condition fails to improve as anticipated.  I provided 12 minutes of non-face-to-face time during this encounter.   Neysa Hotter, MD    [facility] MD/PA/NP OP Progress Note  11/01/2020 2:59 PM DEBORH Walker  MRN:  [id number]  Chief Complaint:  Chief Complaint    Follow-up; Depression; Trauma     HPI:  This is a follow-up appointment for depression and PTSD.  She states that she had another episode of impulsive shopping.  She spent several hundred dollars for items she did not need.  She is also stressed about job Financial controller.  She feels anxious about the current situation.  Although she did apply for some jobs, she has not heard any feedback from them.  She has been trying to stay busy, doing household chores.  Although she did enjoy going to the beach trip for 4 days, she feels exhausted after coming back from the beach.  She believes that higher dose of fluoxetine has been helpful for anxiety.  However, she is concerned of her impulsive traits.  She denies any other impulsive behaviors other than shopping.  She denies decreased need for sleep or euphonia.  She has middle  insomnia.  She feels fatigue.  She thinks she has increase in appetite; she has not measured weight.  She denies SI.  She denies alcohol use or drug use.    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 PTSD (post-traumatic stress disorder)  F43.10   2. Mild episode of recurrent major depressive disorder (HCC)  F33.0     Past Psychiatric History: Please see initial evaluation for full details. I have reviewed the history. No updates at this time.     Past Medical History:  Past Medical History:  Diagnosis Date  . ADD (attention deficit disorder)   . Allergy   . Anxiety   . Asthma   . Depression   . Thyroid disease     Past Surgical History:  Procedure Laterality Date  . CHOLECYSTECTOMY    . CHOLECYSTECTOMY      Family Psychiatric History: Please see initial evaluation for full details. I have reviewed the history. No updates at this time.     Family History:  Family History  Adopted: Yes    Social History:  Social History  Socioeconomic History  . Marital status: Legally Separated    Spouse name: Not on file  . Number of children: Not on file  . Years of education: Not on file  . Highest education level: Not on file  Occupational History  . Not on file  Tobacco Use  . Smoking status: Never Smoker  . Smokeless tobacco: Never Used  Substance and Sexual Activity  . Alcohol use: Yes    Comment: rare  . Drug use: No  . Sexual activity: Not on file  Other Topics Concern  . Not on file  Social History Narrative  . Not on file    Social Determinants of Health   Financial Resource Strain: Not on file  Food Insecurity: Not on file  Transportation Needs: Not on file  Physical Activity: Not on file  Stress: Not on file  Social Connections: Not on file    Allergies:  Allergies  Allergen Reactions  . Morphine Nausea And Vomiting    Metabolic Disorder Labs: No results found for: HGBA1C, MPG No results found for: PROLACTIN Lab Results  Component Value Date   CHOL 185 06/28/2013   TRIG 221.0 (H) 06/28/2013   HDL 44.40 06/28/2013   CHOLHDL 4 06/28/2013   VLDL 44.2 (H) 06/28/2013   Lab Results  Component Value Date   TSH 0.85 06/28/2013    Therapeutic Level Labs: No results found for: LITHIUM No results found for: VALPROATE No components found for:  CBMZ  Current Medications: Current Outpatient Medications  Medication Sig Dispense Refill  . ARIPiprazole (ABILIFY) 5 MG tablet Take 1 tablet (5 mg total) by mouth daily. 30 tablet 1  . albuterol (VENTOLIN HFA) 108 (90 Base) MCG/ACT inhaler Inhale into the lungs.    Elvera Maria ALLERGY & CONGESTION 180-240 MG 24 hr tablet Take 1 tablet by mouth daily.    . ARIPiprazole (ABILIFY) 2 MG tablet Take 1 tablet (2 mg total) by mouth daily. 30 tablet 0  . clonazePAM (KLONOPIN) 1 MG tablet SMARTSIG:1 Tablet(s) By Mouth Every 12 Hours PRN    . [START ON 11/05/2020] FLUoxetine (PROZAC) 20 MG capsule Take 3 capsules (60 mg total) by mouth daily. 90 capsule 2  . fluticasone (FLONASE) 50 MCG/ACT nasal spray TWO PUFFS IN EACH NOSTRIL ONCE A DAY 16 g 1  . gabapentin (NEURONTIN) 300 MG capsule Take by mouth.    . metroNIDAZOLE (FLAGYL) 500 MG tablet Take 1 tablet (500 mg total) by mouth 2 (two) times daily. 14 tablet 0  . Multiple Vitamins-Minerals (MULTIVITAMIN PO) Take 1 tablet by mouth daily. (Patient not taking: Reported on 01/05/2020)    . naproxen (NAPROSYN) 500 MG tablet Take by mouth.    . norethindrone-ethinyl estradiol (LOESTRIN) 1-20 MG-MCG tablet TAKE 1 TABLET  BY MOUTH DAILY 84 tablet 0  . Omega-3 Fatty Acids (FISH OIL) 1000 MG CAPS Take 1 capsule by mouth 2 (two) times daily. (Patient not taking: Reported on 01/05/2020)    . omeprazole (PRILOSEC) 20 MG capsule Take 20 mg by mouth daily.    . polyethylene glycol powder (GLYCOLAX/MIRALAX) 17 GM/SCOOP powder Take by mouth.    . topiramate (TOPAMAX) 50 MG tablet Take 50 mg by mouth 2 (two) times daily.    . traZODone (DESYREL) 100 MG tablet     . vitamin C (ASCORBIC ACID) 500 MG tablet Take 500 mg by mouth 2 (two) times daily. (Patient not taking: Reported on 01/05/2020)     No current facility-administered medications for this visit.  Musculoskeletal: Strength & Muscle Tone: N/A Gait & Station: N/A Patient leans: N/A  Psychiatric Specialty Exam: Review of Systems  Psychiatric/Behavioral: Positive for dysphoric mood and sleep disturbance. Negative for agitation, behavioral problems, confusion, decreased concentration, hallucinations, self-injury and suicidal ideas. The patient is nervous/anxious. The patient is not hyperactive.   All other systems reviewed and are negative.   There were no vitals taken for this visit.There is no height or weight on file to calculate BMI.  General Appearance: Fairly Groomed  Eye Contact:  Good  Speech:  Clear and Coherent  Volume:  Normal  Mood:  Depressed  Affect:  Appropriate, Congruent and down  Thought Process:  Coherent  Orientation:  Full (Time, Place, and Person)  Thought Content: Logical   Suicidal Thoughts:  No  Homicidal Thoughts:  No  Memory:  Immediate;   Good  Judgement:  Good  Insight:  Good  Psychomotor Activity:  Normal  Concentration:  Concentration: Good and Attention Span: Good  Recall:  Good  Fund of Knowledge: Good  Language: Good  Akathisia:  No  Handed:  Right  AIMS (if indicated): not done  Assets:  Communication Skills Desire for Improvement  ADL's:  Intact  Cognition: WNL  Sleep:  Poor   Screenings: PHQ2-9    Flowsheet Row Video Visit from 10/05/2020 in [facility] Video Visit from 09/07/2020 in [facility] Office Visit from 06/28/2013 in [facility] [place]  PHQ-2 Total Score 1 2 0  PHQ-9 Total Score -- 8 --    Flowsheet Row Video Visit from 09/07/2020 in [facility]  C-SSRS RISK CATEGORY No Risk       Assessment and Plan:  SHAVY BEACHEM is a 45 y.o. year old female with a history of ADHD, PTSD, depression, migraine , who presents for follow up appointment for below.   1. PTSD (post-traumatic stress disorder) 2. 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  Psychosocial stressors includes unemployment. She has past trauma, and reports trust issues.  We uptitrate Abilify as adjunctive treatment for depression.  Discussed potential metabolic side effect and EPS.  Will continue fluoxetine to target PTSD and depression.  She will greatly benefit from CBT; will make referral.   # Insomnia She reports snoring, fatigue, and occasional insomnia.  Will make referral for evaluation of sleep apnea.   Plan 1 Continue fluoxeitne 60 mg daily  2.Increase Abilify 5 mg daily - monitor weight 3. Referral to therapy  4. Referral for evaluation of  sleep apnea 5. Next appointment: 6/22 at 10:30  for 30 mins, video - on adzenys 18.8, adderall xr 25, clonazepam 1 mg BID, prescribed by [place] attention specialist - On gabapentin 300 mg TID, topiramate 50 mg for migraine - check TSH if that is not done at the next visit  The patient demonstrates the following risk factors for suicide: Chronic risk factors for suicide include:psychiatric disorder ofdepression, anxiety. Acute risk factorsfor suicide  include: loss (financial, interpersonal, professional). Protective factorsfor this patient include: positive therapeutic relationship, coping skills and hope for the future. Considering these factors, the overall suicide risk at this point appears to below. Patientisappropriate for outpatient follow up.   Neysa Hotter, MD 11/01/2020, 2:59 PM

## 2020-11-01 ENCOUNTER — Encounter: Payer: Self-pay | Admitting: Psychiatry

## 2020-11-01 ENCOUNTER — Telehealth (INDEPENDENT_AMBULATORY_CARE_PROVIDER_SITE_OTHER): Payer: Commercial Managed Care - HMO | Admitting: Psychiatry

## 2020-11-01 ENCOUNTER — Other Ambulatory Visit: Payer: Self-pay

## 2020-11-01 DIAGNOSIS — F330 Major depressive disorder, recurrent, mild: Secondary | ICD-10-CM | POA: Diagnosis not present

## 2020-11-01 DIAGNOSIS — F4310 Post-traumatic stress disorder, unspecified: Secondary | ICD-10-CM | POA: Diagnosis not present

## 2020-11-01 MED ORDER — FLUOXETINE HCL 20 MG PO CAPS: 60.0000 mg | ORAL_CAPSULE | Freq: Every day | ORAL | 2 refills | Status: DC

## 2020-11-01 MED ORDER — ARIPIPRAZOLE 5 MG PO TABS: 5.0000 mg | ORAL_TABLET | Freq: Every day | ORAL | 1 refills | Status: DC

## 2020-11-01 NOTE — Patient Instructions (Signed)
1 Continue fluoxeitne 60 mg daily  2.Increase Abilify 5 mg daily  3. Referral to therapy  4. Referral for evaluation of  sleep apnea 5. Next appointment: 6/22 at 10:30

## 2020-11-02 ENCOUNTER — Other Ambulatory Visit: Payer: Self-pay | Admitting: Certified Nurse Midwife

## 2020-11-02 DIAGNOSIS — Z3041 Encounter for surveillance of contraceptive pills: Secondary | ICD-10-CM

## 2020-11-02 NOTE — Progress Notes (Signed)
Rx Loestrin, see orders. Single pack sent due to lost Rx.    Serafina Royals, CNM [facility] 11/02/20 12:05 PM

## 2020-12-04 NOTE — Progress Notes (Deleted)
BH MD/PA/NP OP Progress Note  12/04/2020 12:37 PM Joanna Walker  MRN:  [id number]  Chief Complaint:  HPI: *** Visit Diagnosis: No diagnosis found.  Past Psychiatric History: Please see initial evaluation for full details. I have reviewed the history. No updates at this time.     Past Medical History:  Past Medical History:  Diagnosis Date   ADD (attention deficit disorder)    Allergy    Anxiety    Asthma    Depression    Thyroid disease     Past Surgical History:  Procedure Laterality Date   CHOLECYSTECTOMY     CHOLECYSTECTOMY      Family Psychiatric History: Please see initial evaluation for full details. I have reviewed the history. No updates at this time.     Family History:  Family History  Adopted: Yes    Social History:  Social History   Socioeconomic History   Marital status: Legally Separated    Spouse name: Not on file   Number of children: Not on file   Years of education: Not on file   Highest education level: Not on file  Occupational History   Not on file  Tobacco Use   Smoking status: Never   Smokeless tobacco: Never  Substance and Sexual Activity   Alcohol use: Yes    Comment: rare   Drug use: No   Sexual activity: Not on file  Other Topics Concern   Not on file  Social History Narrative   Not on file   Social Determinants of Health   Financial Resource Strain: Not on file  Food Insecurity: Not on file  Transportation Needs: Not on file  Physical Activity: Not on file  Stress: Not on file  Social Connections: Not on file    Allergies:  Allergies  Allergen Reactions   Morphine Nausea And Vomiting    Metabolic Disorder Labs: No results found for: HGBA1C, MPG No results found for: PROLACTIN Lab Results  Component Value Date   CHOL 185 06/28/2013   TRIG 221.0 (H) 06/28/2013   HDL 44.40 06/28/2013   CHOLHDL 4 06/28/2013   VLDL 44.2 (H) 06/28/2013   Lab Results  Component Value Date   TSH 0.85 06/28/2013     Therapeutic Level Labs: No results found for: LITHIUM No results found for: VALPROATE No components found for:  CBMZ  Current Medications: Current Outpatient Medications  Medication Sig Dispense Refill   albuterol (VENTOLIN HFA) 108 (90 Base) MCG/ACT inhaler Inhale into the lungs.     ALLEGRA-D ALLERGY & CONGESTION 180-240 MG 24 hr tablet Take 1 tablet by mouth daily.     ARIPiprazole (ABILIFY) 2 MG tablet Take 1 tablet (2 mg total) by mouth daily. 30 tablet 0   ARIPiprazole (ABILIFY) 5 MG tablet Take 1 tablet (5 mg total) by mouth daily. 30 tablet 1   clonazePAM (KLONOPIN) 1 MG tablet SMARTSIG:1 Tablet(s) By Mouth Every 12 Hours PRN     FLUoxetine (PROZAC) 20 MG capsule Take 3 capsules (60 mg total) by mouth daily. 90 capsule 2   fluticasone (FLONASE) 50 MCG/ACT nasal spray TWO PUFFS IN EACH NOSTRIL ONCE A DAY 16 g 1   gabapentin (NEURONTIN) 300 MG capsule Take by mouth.     metroNIDAZOLE (FLAGYL) 500 MG tablet Take 1 tablet (500 mg total) by mouth 2 (two) times daily. 14 tablet 0   Multiple Vitamins-Minerals (MULTIVITAMIN PO) Take 1 tablet by mouth daily. (Patient not taking: Reported on 01/05/2020)  naproxen (NAPROSYN) 500 MG tablet Take by mouth.     norethindrone-ethinyl estradiol (LOESTRIN) 1-20 MG-MCG tablet TAKE 1 TABLET BY MOUTH DAILY 84 tablet 0   Omega-3 Fatty Acids (FISH OIL) 1000 MG CAPS Take 1 capsule by mouth 2 (two) times daily. (Patient not taking: Reported on 01/05/2020)     omeprazole (PRILOSEC) 20 MG capsule Take 20 mg by mouth daily.     polyethylene glycol powder (GLYCOLAX/MIRALAX) 17 GM/SCOOP powder Take by mouth.     topiramate (TOPAMAX) 50 MG tablet Take 50 mg by mouth 2 (two) times daily.     traZODone (DESYREL) 100 MG tablet      vitamin C (ASCORBIC ACID) 500 MG tablet Take 500 mg by mouth 2 (two) times daily. (Patient not taking: Reported on 01/05/2020)     No current facility-administered medications for this visit.     Musculoskeletal: Strength &  Muscle Tone:  N/A Gait & Station:  N/A Patient leans: N/A  Psychiatric Specialty Exam: Review of Systems  There were no vitals taken for this visit.There is no height or weight on file to calculate BMI.  General Appearance: {Appearance:22683}  Eye Contact:  {BHH EYE CONTACT:22684}  Speech:  Clear and Coherent  Volume:  Normal  Mood:  {BHH MOOD:22306}  Affect:  {Affect (PAA):22687}  Thought Process:  Coherent  Orientation:  Full (Time, Place, and Person)  Thought Content: Logical   Suicidal Thoughts:  {ST/HT (PAA):22692}  Homicidal Thoughts:  {ST/HT (PAA):22692}  Memory:  Immediate;   Good  Judgement:  {Judgement (PAA):22694}  Insight:  {Insight (PAA):22695}  Psychomotor Activity:  Normal  Concentration:  Concentration: Good and Attention Span: Good  Recall:  Good  Fund of Knowledge: Good  Language: Good  Akathisia:  No  Handed:  Right  AIMS (if indicated): not done  Assets:  Communication Skills Desire for Improvement  ADL's:  Intact  Cognition: WNL  Sleep:  {BHH GOOD/FAIR/POOR:22877}   Screenings: PHQ2-9    Flowsheet Row Video Visit from 10/05/2020 in [facility] Video Visit from 09/07/2020 in [facility] Office Visit from 06/28/2013 in [facility] [place]  PHQ-2 Total Score 1 2 0  PHQ-9 Total Score -- 8 --      Flowsheet Row Video Visit from 09/07/2020 in [facility]  C-SSRS RISK CATEGORY No Risk        Assessment and Plan:  Joanna Walker is a 45 y.o. year old female with a history of ADHD, PTSD, depression,  migraine, who presents for follow up appointment for below.   1. PTSD (post-traumatic stress disorder) 2. 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   Psychosocial stressors includes unemployment. She has past trauma, and reports trust issues.  We uptitrate Abilify as adjunctive treatment for depression.  Discussed potential metabolic side effect and EPS.  Will continue fluoxetine to target PTSD and depression.  She will greatly benefit from CBT; will make referral.    # Insomnia She reports snoring, fatigue, and occasional insomnia.  Will make referral for evaluation of sleep apnea.    Plan 1 Continue fluoxeitne 60 mg daily 2.Increase Abilify 5 mg daily - monitor weight 3. Referral to therapy  4. Referral for evaluation of  sleep apnea 5. Next appointment: 6/22 at 10:30  for 30 mins, video -  on [employer]  18.8, adderall xr 25, clonazepam 1 mg BID, prescribed by [place] attention specialist -  On gabapentin 300 mg TID, topiramate 50 mg for migraine - check TSH if that is not done at the next visit   The patient demonstrates the following risk factors for suicide: Chronic risk factors for suicide include: psychiatric disorder of depression, anxiety. Acute risk factors for suicide include: loss (financial, interpersonal, professional). Protective factors for this patient include: positive therapeutic relationship, coping skills and hope for the future. Considering these factors, the overall suicide risk at this point appears to be low. Patient is appropriate for outpatient follow up.       Neysa Hotter, MD 12/04/2020, 12:37 PM

## 2020-12-06 ENCOUNTER — Other Ambulatory Visit: Payer: Self-pay

## 2020-12-06 ENCOUNTER — Telehealth: Payer: Commercial Managed Care - HMO | Admitting: Psychiatry

## 2020-12-06 NOTE — Progress Notes (Deleted)
BH MD/PA/NP OP Progress Note  12/06/2020 10:25 AM Joanna Walker  MRN:  [id number]  Chief Complaint:  HPI: *** Visit Diagnosis: No diagnosis found.  Past Psychiatric History: Please see initial evaluation for full details. I have reviewed the history. No updates at this time.     Past Medical History:  Past Medical History:  Diagnosis Date   ADD (attention deficit disorder)    Allergy    Anxiety    Asthma    Depression    Thyroid disease     Past Surgical History:  Procedure Laterality Date   CHOLECYSTECTOMY     CHOLECYSTECTOMY      Family Psychiatric History: Please see initial evaluation for full details. I have reviewed the history. No updates at this time.    Family History:  Family History  Adopted: Yes    Social History:  Social History   Socioeconomic History   Marital status: Legally Separated    Spouse name: Not on file   Number of children: Not on file   Years of education: Not on file   Highest education level: Not on file  Occupational History   Not on file  Tobacco Use   Smoking status: Never   Smokeless tobacco: Never  Substance and Sexual Activity   Alcohol use: Yes    Comment: rare   Drug use: No   Sexual activity: Not on file  Other Topics Concern   Not on file  Social History Narrative   Not on file   Social Determinants of Health   Financial Resource Strain: Not on file  Food Insecurity: Not on file  Transportation Needs: Not on file  Physical Activity: Not on file  Stress: Not on file  Social Connections: Not on file    Allergies:  Allergies  Allergen Reactions   Morphine Nausea And Vomiting    Metabolic Disorder Labs: No results found for: HGBA1C, MPG No results found for: PROLACTIN Lab Results  Component Value Date   CHOL 185 06/28/2013   TRIG 221.0 (H) 06/28/2013   HDL 44.40 06/28/2013   CHOLHDL 4 06/28/2013   VLDL 44.2 (H) 06/28/2013   Lab Results  Component Value Date   TSH 0.85 06/28/2013     Therapeutic Level Labs: No results found for: LITHIUM No results found for: VALPROATE No components found for:  CBMZ  Current Medications: Current Outpatient Medications  Medication Sig Dispense Refill   albuterol (VENTOLIN HFA) 108 (90 Base) MCG/ACT inhaler Inhale into the lungs.     ALLEGRA-D ALLERGY & CONGESTION 180-240 MG 24 hr tablet Take 1 tablet by mouth daily.     ARIPiprazole (ABILIFY) 2 MG tablet Take 1 tablet (2 mg total) by mouth daily. 30 tablet 0   ARIPiprazole (ABILIFY) 5 MG tablet Take 1 tablet (5 mg total) by mouth daily. 30 tablet 1   clonazePAM (KLONOPIN) 1 MG tablet SMARTSIG:1 Tablet(s) By Mouth Every 12 Hours PRN     FLUoxetine (PROZAC) 20 MG capsule Take 3 capsules (60 mg total) by mouth daily. 90 capsule 2   fluticasone (FLONASE) 50 MCG/ACT nasal spray TWO PUFFS IN EACH NOSTRIL ONCE A DAY 16 g 1   gabapentin (NEURONTIN) 300 MG capsule Take by mouth.     metroNIDAZOLE (FLAGYL) 500 MG tablet Take 1 tablet (500 mg total) by mouth 2 (two) times daily. 14 tablet 0   Multiple Vitamins-Minerals (MULTIVITAMIN PO) Take 1 tablet by mouth daily. (Patient not taking: Reported on 01/05/2020)     naproxen (  NAPROSYN) 500 MG tablet Take by mouth.     norethindrone-ethinyl estradiol (LOESTRIN) 1-20 MG-MCG tablet TAKE 1 TABLET BY MOUTH DAILY 84 tablet 0   Omega-3 Fatty Acids (FISH OIL) 1000 MG CAPS Take 1 capsule by mouth 2 (two) times daily. (Patient not taking: Reported on 01/05/2020)     omeprazole (PRILOSEC) 20 MG capsule Take 20 mg by mouth daily.     polyethylene glycol powder (GLYCOLAX/MIRALAX) 17 GM/SCOOP powder Take by mouth.     topiramate (TOPAMAX) 50 MG tablet Take 50 mg by mouth 2 (two) times daily.     traZODone (DESYREL) 100 MG tablet      vitamin C (ASCORBIC ACID) 500 MG tablet Take 500 mg by mouth 2 (two) times daily. (Patient not taking: Reported on 01/05/2020)     No current facility-administered medications for this visit.     Musculoskeletal: Strength &  Muscle Tone:  N/A Gait & Station:  N/A Patient leans: N/A  Psychiatric Specialty Exam: Review of Systems  There were no vitals taken for this visit.There is no height or weight on file to calculate BMI.  General Appearance: {Appearance:22683}  Eye Contact:  {BHH EYE CONTACT:22684}  Speech:  Clear and Coherent  Volume:  Normal  Mood:  {BHH MOOD:22306}  Affect:  {Affect (PAA):22687}  Thought Process:  Coherent  Orientation:  Full (Time, Place, and Person)  Thought Content: Logical   Suicidal Thoughts:  {ST/HT (PAA):22692}  Homicidal Thoughts:  {ST/HT (PAA):22692}  Memory:  Immediate;   Good  Judgement:  {Judgement (PAA):22694}  Insight:  {Insight (PAA):22695}  Psychomotor Activity:  Normal  Concentration:  Concentration: Good and Attention Span: Good  Recall:  Good  Fund of Knowledge: Good  Language: Good  Akathisia:  No  Handed:  Right  AIMS (if indicated): not done  Assets:  Communication Skills Desire for Improvement  ADL's:  Intact  Cognition: WNL  Sleep:  {BHH GOOD/FAIR/POOR:22877}   Screenings: PHQ2-9    Flowsheet Row Video Visit from 10/05/2020 in [facility] Video Visit from 09/07/2020 in [facility] Office Visit from 06/28/2013 in [facility] [place]  PHQ-2 Total Score 1 2 0  PHQ-9 Total Score -- 8 --      Flowsheet Row Video Visit from 09/07/2020 in [facility]  C-SSRS RISK CATEGORY No Risk        Assessment and Plan:  Joanna Walker is a 45 y.o. year old female with a history of ADHD, PTSD, depression,  migraine, who presents for follow up appointment for below.   1. PTSD (post-traumatic stress disorder) 2. 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   Psychosocial stressors includes unemployment. She has past trauma, and reports trust issues.  We uptitrate Abilify as adjunctive treatment for depression.  Discussed potential metabolic side effect and EPS.  Will continue fluoxetine to target PTSD and depression.  She will greatly benefit from CBT; will make referral.    # Insomnia She reports snoring, fatigue, and occasional insomnia.  Will make referral for evaluation of sleep apnea.    Plan 1 Continue fluoxeitne 60 mg daily 2.Increase Abilify 5 mg daily - monitor weight 3. Referral to therapy  4. Referral for evaluation of  sleep apnea 5. Next appointment: 6/22 at 10:30  for 30 mins, video -  on [employer] 18.8,  adderall xr 25, clonazepam 1 mg BID, prescribed by [place] attention specialist -  On gabapentin 300 mg TID, topiramate 50 mg for migraine - check TSH if that is not done at the next visit   The patient demonstrates the following risk factors for suicide: Chronic risk factors for suicide include: psychiatric disorder of depression, anxiety. Acute risk factors for suicide include: loss (financial, interpersonal, professional). Protective factors for this patient include: positive therapeutic relationship, coping skills and hope for the future. Considering these factors, the overall suicide risk at this point appears to be low. Patient is appropriate for outpatient follow up.      Neysa Hotter, MD 12/06/2020, 10:25 AM

## 2020-12-07 ENCOUNTER — Telehealth: Payer: Self-pay | Admitting: Psychiatry

## 2020-12-07 ENCOUNTER — Encounter: Payer: BLUE CROSS/BLUE SHIELD | Admitting: Certified Nurse Midwife

## 2020-12-07 NOTE — Telephone Encounter (Signed)
Sent link for video visit through Epic. Patient did not sign in. Called the patient for appointment scheduled today. The patient did not answer the phone. Left voice message to contact the office ([phone]).   ?

## 2020-12-19 ENCOUNTER — Other Ambulatory Visit: Payer: Self-pay | Admitting: Certified Nurse Midwife

## 2020-12-19 DIAGNOSIS — Z1231 Encounter for screening mammogram for malignant neoplasm of breast: Secondary | ICD-10-CM

## 2020-12-21 ENCOUNTER — Encounter: Payer: Self-pay | Admitting: Certified Nurse Midwife

## 2020-12-28 ENCOUNTER — Telehealth: Payer: BLUE CROSS/BLUE SHIELD | Admitting: Psychiatry

## 2020-12-28 ENCOUNTER — Other Ambulatory Visit: Payer: Self-pay

## 2020-12-28 ENCOUNTER — Telehealth: Payer: Self-pay | Admitting: Psychiatry

## 2020-12-28 NOTE — Telephone Encounter (Signed)
Sent link for video visit through Epic. Patient did not sign in. Called the patient for appointment scheduled today. The patient did not answer the phone. Left voice message to contact the office ([phone]).   ?

## 2020-12-28 NOTE — Progress Notes (Signed)
Virtual Visit via Video Note  I connected with Joanna Walker on 12/29/20 at 11:30 AM EDT by a video enabled telemedicine application and verified that I am speaking with the correct person using two identifiers.  Location: Patient: home Provider: office Persons participated in the visit- patient, provider    I discussed the limitations of evaluation and management by telemedicine and the availability of in person appointments. The patient expressed understanding and agreed to proceed.     I discussed the assessment and treatment plan with the patient. The patient was provided an opportunity to ask questions and all were answered. The patient agreed with the plan and demonstrated an understanding of the instructions.   The patient was advised to call back or seek an in-person evaluation if the symptoms worsen or if the condition fails to improve as anticipated.  I provided 21 minutes of non-face-to-face time during this encounter.   Neysa Hottereina Kolbey Teichert, MD    [facility] OP Progress Note  12/29/2020 12:11 PM Joanna Walker  MRN:  [id number]  Chief Complaint:  Chief Complaint   Follow-up; Depression    HPI:  This is a follow-up appointment for depression and PTSD.  She states that she ended relationship lately.  She may have pushed it to come sooner.  She states that she was over thinking when he seems to be distant, although he was very good to her.  She feels like a failure.  She has gotten a job, and will start next Monday.  She feels anxious, although she feels good about this.  She was not on Abilify for a while as there was issues with insurance.  She restarted it several days ago.  Although it has been helping her mood, she is concerned about weight gain.  She would like to try another medication.  She has occasional insomnia.  She denies change in appetite.  She feels depressed.  She denies SI.  She feels anxious and tense at times.  She denies nightmares.    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 PTSD (post-traumatic stress disorder)  F43.10     2. Mild episode of recurrent major depressive disorder (HCC)  F33.0       Past Psychiatric History: Please see initial evaluation for full details. I have reviewed the history. No updates at this time.     Past Medical History:  Past Medical History:  Diagnosis Date   ADD (attention deficit disorder)    Allergy    Anxiety    Asthma    Depression    Thyroid disease     Past Surgical History:  Procedure Laterality Date   CHOLECYSTECTOMY     CHOLECYSTECTOMY      Family Psychiatric History: Please see initial evaluation for full details. I have reviewed the history. No updates at this time.     Family History:  Family History  Adopted: Yes    Social History:  Social History   Socioeconomic History   Marital status: Legally Separated    Spouse name: Not on file   Number of children: Not on file   Years of education: Not on file   Highest education level: Not on file  Occupational History   Not on file  Tobacco Use   Smoking status: Never   Smokeless tobacco: Never  Substance and Sexual Activity   Alcohol use: Yes    Comment: rare   Drug use: No   Sexual activity: Not on file  Other Topics Concern   Not on file  Social History Narrative   Not on file   Social Determinants of Health   Financial Resource  Strain: Not on file  Food Insecurity: Not on file  Transportation Needs: Not on file  Physical Activity: Not on file  Stress: Not on file  Social Connections: Not on file    Allergies:  Allergies  Allergen Reactions   Morphine Nausea And Vomiting    Metabolic Disorder Labs: No results found for: HGBA1C, MPG No results found for: PROLACTIN Lab Results  Component Value Date   CHOL 185 06/28/2013   TRIG 221.0 (H) 06/28/2013   HDL 44.40 06/28/2013   CHOLHDL 4 06/28/2013   VLDL 44.2 (H) 06/28/2013   Lab Results  Component Value Date   TSH 0.85 06/28/2013    Therapeutic Level Labs: No results found for: LITHIUM No results found for: VALPROATE No components found for:  CBMZ  Current Medications: Current Outpatient Medications  Medication Sig Dispense Refill   albuterol (VENTOLIN HFA) 108 (90 Base) MCG/ACT inhaler Inhale into the lungs.     ALLEGRA-D ALLERGY & CONGESTION 180-240 MG 24 hr tablet Take 1 tablet by mouth daily.     ARIPiprazole (ABILIFY) 2 MG tablet Take 1 tablet (2 mg total) by mouth daily. 30 tablet 0   ARIPiprazole (ABILIFY) 5 MG tablet Take 1 tablet (5 mg total) by mouth daily. 30 tablet 1   clonazePAM (KLONOPIN) 1 MG tablet SMARTSIG:1 Tablet(s) By Mouth Every 12 Hours PRN     FLUoxetine (PROZAC) 20 MG capsule Take 3 capsules (60 mg total) by mouth daily. 90 capsule 2   fluticasone (FLONASE) 50 MCG/ACT nasal spray TWO PUFFS IN EACH NOSTRIL ONCE A DAY 16 g 1   gabapentin (NEURONTIN) 300 MG capsule Take by mouth.     metroNIDAZOLE (FLAGYL) 500 MG tablet Take 1 tablet (500 mg total) by mouth 2 (two) times daily. 14 tablet 0   Multiple Vitamins-Minerals (MULTIVITAMIN PO) Take 1 tablet by mouth daily. (Patient not taking: Reported on 01/05/2020)     naproxen (NAPROSYN) 500 MG tablet Take by mouth.     norethindrone-ethinyl estradiol (LOESTRIN) 1-20 MG-MCG tablet TAKE 1 TABLET BY MOUTH DAILY 84 tablet 0   Omega-3 Fatty Acids (FISH OIL) 1000 MG CAPS Take 1 capsule  by mouth 2 (two) times daily. (Patient not taking: Reported on 01/05/2020)     omeprazole (PRILOSEC) 20 MG capsule Take 20 mg by mouth daily.     polyethylene glycol powder (GLYCOLAX/MIRALAX) 17 GM/SCOOP powder Take by mouth.     topiramate (TOPAMAX) 50 MG tablet Take 50 mg by mouth 2 (two) times daily.     traZODone (DESYREL) 100 MG tablet      vitamin C (ASCORBIC ACID) 500 MG tablet Take 500 mg by mouth 2 (two) times daily. (Patient not taking: Reported on 01/05/2020)  No current facility-administered medications for this visit.     Musculoskeletal: Strength & Muscle Tone:  N/A Gait & Station:  N/A Patient leans: N/A  Psychiatric Specialty Exam: Review of Systems  Psychiatric/Behavioral:  Positive for decreased concentration, dysphoric mood and sleep disturbance. Negative for agitation, behavioral problems, confusion, hallucinations, self-injury and suicidal ideas. The patient is nervous/anxious. The patient is not hyperactive.   All other systems reviewed and are negative.  There were no vitals taken for this visit.There is no height or weight on file to calculate BMI.  General Appearance: Fairly Groomed  Eye Contact:  Good  Speech:  Clear and Coherent  Volume:  Normal  Mood:  Anxious and Depressed  Affect:  Appropriate, Congruent, and euthymic  Thought Process:  Coherent  Orientation:  Full (Time, Place, and Person)  Thought Content: Logical   Suicidal Thoughts:  No  Homicidal Thoughts:  No  Memory:  Immediate;   Good  Judgement:  Good  Insight:  Fair  Psychomotor Activity:  Normal  Concentration:  Concentration: Good and Attention Span: Good  Recall:  Good  Fund of Knowledge: Good  Language: Good  Akathisia:  No  Handed:  Right  AIMS (if indicated): not done  Assets:  Communication Skills Desire for Improvement  ADL's:  Intact  Cognition: WNL  Sleep:  Fair   Screenings: PHQ2-9    Flowsheet Row Video Visit from 10/05/2020 in [facility] Video Visit from 09[facility]07/2020 in [facility] Office Visit from 06/28/2013 in [facility] [place]  PHQ-2 Total Score 1 2 0  PHQ-9 Total Score -- 8 --      Flowsheet Row Video Visit from 09/07/2020 in [facility]  C-SSRS RISK CATEGORY No Risk        Assessment and Plan:  Joanna Walker is a 45 y.o. year old female with a history of ADHD, PTSD, depression,  migraine, who presents for follow up appointment for below.   1. PTSD (post-traumatic stress disorder) 2. Mild episode of recurrent major depressive disorder (HCC) She continues to report depressive symptoms and an anxiety in the context of break-up.  She has trauma, and reports issues with trust.  She will start a new job next Monday, and is future oriented.  Although she had a good benefit from Abilify, she has noticed weight gain.  Will discontinue this medication.  Will plan to switch from fluoxetine to Lexapro to optimize treatment for PTSD and depression. She will greatly benefit from CBT; will make referral.    # Insomnia Unchanged. She reports snoring, fatigue, and occasional insomnia.  Made referral for evaluation of sleep apnea.    Plan Discontinue fluoxetine  Start lexapro 5 mg daily for one week, then 10 mg daily Discontinue Abilify 4.   Referral to therapy  5. Next appointment- 8/26 at 11 AM, video -  on adzenys 18.8, adderall xr 25, clonazepam 1 mg BID, prescribed by [place] attention specialist -  On gabapentin 300 mg TID, topiramate 50 mg for migraine - check TSH if that is not done at the next visit  Addendum: the original plan was to start venlafaxine instead of lexapro. Lexapro is likely preferred after having reviewed the dosage of stimulant she is on.  This clinician called and left the voice message to continue current medication at this time (fluoxetine for depression, PTSD, and Abilify for depression) with plan to discuss the  above change on Monday.   Past trials of medication:  lithium (had some negative reaction)   The patient demonstrates the following risk factors for suicide: Chronic risk factors for suicide include: psychiatric disorder of depression, anxiety. Acute risk factors for suicide include: loss (financial, interpersonal, professional). Protective factors for this patient include: positive therapeutic relationship, coping skills and hope for the future. Considering these factors, the overall suicide risk at this point appears to be low. Patient is appropriate for outpatient follow up.         Neysa Hotter, MD 12/29/2020, 12:11 PM

## 2020-12-29 ENCOUNTER — Encounter: Payer: Self-pay | Admitting: Psychiatry

## 2020-12-29 ENCOUNTER — Telehealth (INDEPENDENT_AMBULATORY_CARE_PROVIDER_SITE_OTHER): Payer: Self-pay | Admitting: Psychiatry

## 2020-12-29 ENCOUNTER — Other Ambulatory Visit: Payer: Self-pay

## 2020-12-29 DIAGNOSIS — F330 Major depressive disorder, recurrent, mild: Secondary | ICD-10-CM

## 2020-12-29 DIAGNOSIS — F4310 Post-traumatic stress disorder, unspecified: Secondary | ICD-10-CM

## 2021-01-01 ENCOUNTER — Telehealth: Payer: Self-pay | Admitting: Psychiatry

## 2021-01-01 MED ORDER — ESCITALOPRAM OXALATE 10 MG PO TABS: ORAL_TABLET | ORAL | 1 refills | Status: DC

## 2021-01-01 NOTE — Telephone Encounter (Signed)
Discussed with the patient.  She is open to start Lexapro instead of venlafaxine.  Will start from lower dose to avoid potential risk of serotonin syndrome. Will do as follows.  She is advised to contact the clinic if she experiences any side effect.   Discontinue fluoxetine, Abilify Start lexapro 5 mg daily for one week, then 10 mg daily

## 2021-01-11 ENCOUNTER — Telehealth: Payer: Self-pay | Admitting: Psychiatry

## 2021-01-16 ENCOUNTER — Encounter: Payer: BLUE CROSS/BLUE SHIELD | Admitting: Obstetrics and Gynecology

## 2021-01-17 ENCOUNTER — Encounter: Payer: Self-pay | Admitting: Obstetrics and Gynecology

## 2021-01-18 ENCOUNTER — Other Ambulatory Visit: Payer: Self-pay | Admitting: Certified Nurse Midwife

## 2021-01-18 DIAGNOSIS — Z1231 Encounter for screening mammogram for malignant neoplasm of breast: Secondary | ICD-10-CM

## 2021-01-22 NOTE — Telephone Encounter (Signed)
Dr Logan Bores has no availability this week.  I will call her if we have a cancellation

## 2021-01-24 ENCOUNTER — Other Ambulatory Visit: Payer: Self-pay

## 2021-01-24 ENCOUNTER — Other Ambulatory Visit (HOSPITAL_COMMUNITY)
Admission: RE | Admit: 2021-01-24 | Discharge: 2021-01-24 | Disposition: A | Discharge status: Home / Self Care | Payer: 59 | Source: Ambulatory Visit | Attending: Obstetrics and Gynecology | Admitting: Obstetrics and Gynecology

## 2021-01-24 ENCOUNTER — Encounter: Payer: Self-pay | Admitting: Obstetrics and Gynecology

## 2021-01-24 ENCOUNTER — Ambulatory Visit (INDEPENDENT_AMBULATORY_CARE_PROVIDER_SITE_OTHER): Payer: 59 | Admitting: Obstetrics and Gynecology

## 2021-01-24 VITALS — BP 129/88 | HR 123 | Resp 16 | Ht 63.0 in | Wt 166.8 lb

## 2021-01-24 DIAGNOSIS — N898 Other specified noninflammatory disorders of vagina: Secondary | ICD-10-CM | POA: Diagnosis present

## 2021-01-24 MED ORDER — METRONIDAZOLE 500 MG PO TABS: 500.0000 mg | ORAL_TABLET | Freq: Two times a day (BID) | ORAL | 0 refills | Status: DC

## 2021-01-24 NOTE — Progress Notes (Signed)
HPI:      [name] is a 45 y.o. No obstetric history on file. who LMP was No LMP recorded (lmp unknown). (Menstrual status: Oral contraceptives).  Subjective:   She presents today with complaint of 2-week history of vaginal discharge with odor.  She states that is similar to previous episodes of BV.  She has tried over-the-counter antifungal medications without relief.  She does state that she has a new sexual partner but doubts that it is an STD. Because she has already dealt with it for 2 weeks and previously has had BV she is interested in treatment for BV prior to culture results.    Hx: The following portions of the patient's history were reviewed and updated as appropriate:             She  has a past medical history of ADD (attention deficit disorder), Allergy, Anxiety, Asthma, Depression, and Thyroid disease. She does not have any pertinent problems on file. She  has a past surgical history that includes Cholecystectomy and Cholecystectomy. Her family history is not on file. She was adopted. She  reports that she has never smoked. She has never used smokeless tobacco. She reports current alcohol use. She reports that she does not use drugs. She has a current medication list which includes the following prescription(s): albuterol, allegra-d allergy & congestion, clonazepam, escitalopram, fluticasone, gabapentin, metronidazole, metronidazole, naproxen, norethindrone-ethinyl estradiol, fish oil, omeprazole, polyethylene glycol powder, topiramate, trazodone, vitamin c, multiple vitamin, and [DISCONTINUED] bupropion. She is allergic to morphine.       Review of Systems:  Review of Systems  Constitutional: Denied constitutional symptoms, night sweats, recent illness, fatigue, fever, insomnia and weight loss.  Eyes: Denied eye symptoms, eye pain, photophobia, vision change and visual disturbance.  Ears/Nose/Throat/Neck: Denied ear, nose, throat or neck symptoms, hearing  loss, nasal discharge, sinus congestion and sore throat.  Cardiovascular: Denied cardiovascular symptoms, arrhythmia, chest pain/pressure, edema, exercise intolerance, orthopnea and palpitations.  Respiratory: Denied pulmonary symptoms, asthma, pleuritic pain, productive sputum, cough, dyspnea and wheezing.  Gastrointestinal: Denied, gastro-esophageal reflux, melena, nausea and vomiting.  Genitourinary: See HPI for additional information.  Musculoskeletal: Denied musculoskeletal symptoms, stiffness, swelling, muscle weakness and myalgia.  Dermatologic: Denied dermatology symptoms, rash and scar.  Neurologic: Denied neurology symptoms, dizziness, headache, neck pain and syncope.  Psychiatric: Denied psychiatric symptoms, anxiety and depression.  Endocrine: Denied endocrine symptoms including hot flashes and night sweats.   Meds:   Current Outpatient Medications on File Prior to Visit  Medication Sig Dispense Refill   albuterol (VENTOLIN HFA) 108 (90 Base) MCG/ACT inhaler Inhale into the lungs.     ALLEGRA-D ALLERGY & CONGESTION 180-240 MG 24 hr tablet Take 1 tablet by mouth daily.     clonazePAM (KLONOPIN) 1 MG tablet SMARTSIG:1 Tablet(s) By Mouth Every 12 Hours PRN     escitalopram (LEXAPRO) 10 MG tablet 5 mg daily for one week, then 10 mg daily 30 tablet 1   fluticasone (FLONASE) 50 MCG/ACT nasal spray TWO PUFFS IN EACH NOSTRIL ONCE A DAY 16 g 1   gabapentin (NEURONTIN) 300 MG capsule Take by mouth.     metroNIDAZOLE (FLAGYL) 500 MG tablet Take 1 tablet (500 mg total) by mouth 2 (two) times daily. 14 tablet 0   naproxen (NAPROSYN) 500 MG tablet Take by mouth.     norethindrone-ethinyl estradiol (LOESTRIN) 1-20 MG-MCG tablet TAKE 1 TABLET BY MOUTH DAILY 84 tablet 0   Omega-3 Fatty Acids (FISH OIL) 1000 MG CAPS Take 1  capsule by mouth 2 (two) times daily.     omeprazole (PRILOSEC) 20 MG capsule Take 20 mg by mouth daily.     polyethylene glycol powder (GLYCOLAX/MIRALAX) 17 GM/SCOOP powder  Take by mouth.     topiramate (TOPAMAX) 50 MG tablet Take 50 mg by mouth 2 (two) times daily.     traZODone (DESYREL) 100 MG tablet      vitamin C (ASCORBIC ACID) 500 MG tablet Take 500 mg by mouth 2 (two) times daily.     Multiple Vitamins-Minerals (MULTIVITAMIN PO) Take 1 tablet by mouth daily. (Patient not taking: No sig reported)     [DISCONTINUED] buPROPion (WELLBUTRIN XL) 150 MG 24 hr tablet Take 1 tablet (150 mg total) by mouth daily. 30 tablet 2   No current facility-administered medications on file prior to visit.      Objective:     Vitals:   01/24/21 1536  BP: 129/88  Pulse: (!) 123  Resp: 16   Filed Weights   01/24/21 1536  Weight: 166 lb 12.8 oz (75.7 kg)              Patient desire to self swab  Assessment:    No obstetric history on file. Patient Active Problem List   Diagnosis Date Noted   Chest pain 10/20/2013   Allergic rhinitis 09/28/2013   Positive urine drug screen 08/16/2013   ADD (attention deficit disorder) 02/23/2013   Migraine with aura 01/13/2013   Anxiety and depression    Allergy      1. Vaginal odor   2. Vaginal discharge        Plan:            1.  Nuswab performed   We will presumptively treat for BV with Flagyl. Orders No orders of the defined types were placed in this encounter.    Meds ordered this encounter  Medications   metroNIDAZOLE (FLAGYL) 500 MG tablet    Sig: Take 1 tablet (500 mg total) by mouth 2 (two) times daily.    Dispense:  14 tablet    Refill:  0      F/U  Return for We will contact her with any abnormal test results, Annual Physical. I spent 22 minutes involved in the care of this patient preparing to see the patient by obtaining and reviewing her medical history (including labs, imaging tests and prior procedures), documenting clinical information in the electronic health record (EHR), counseling and coordinating care plans, writing and sending prescriptions, ordering tests or procedures and in  direct communicating with the patient and medical staff discussing pertinent items from her history and physical exam.  Elonda Husky, M.D. 01/24/2021 3:58 PM

## 2021-01-25 ENCOUNTER — Ambulatory Visit
Admission: RE | Admit: 2021-01-25 | Discharge: 2021-01-25 | Disposition: A | Discharge status: Home / Self Care | Payer: 59 | Source: Ambulatory Visit

## 2021-01-25 ENCOUNTER — Telehealth: Payer: Self-pay

## 2021-01-25 DIAGNOSIS — Z1231 Encounter for screening mammogram for malignant neoplasm of breast: Secondary | ICD-10-CM

## 2021-01-25 NOTE — Telephone Encounter (Signed)
Decline. There may be a change in dose/medication.

## 2021-01-25 NOTE — Telephone Encounter (Signed)
Pharmacy faxed with note

## 2021-01-25 NOTE — Telephone Encounter (Signed)
received a request of a 90 day supply of the escitalopram 10mg 

## 2021-01-26 ENCOUNTER — Other Ambulatory Visit: Payer: Self-pay | Admitting: Obstetrics and Gynecology

## 2021-01-26 ENCOUNTER — Other Ambulatory Visit: Payer: Self-pay

## 2021-01-26 DIAGNOSIS — B373 Candidiasis of vulva and vagina: Secondary | ICD-10-CM

## 2021-01-26 DIAGNOSIS — B3731 Acute candidiasis of vulva and vagina: Secondary | ICD-10-CM

## 2021-01-26 LAB — CERVICOVAGINAL ANCILLARY ONLY
Bacterial Vaginitis (gardnerella): POSITIVE — AB
Candida Glabrata: NEGATIVE
Candida Vaginitis: POSITIVE — AB
Comment: NEGATIVE
Comment: NEGATIVE
Comment: NEGATIVE

## 2021-01-26 MED ORDER — FLUCONAZOLE 150 MG PO TABS
150.0000 mg | ORAL_TABLET | ORAL | 3 refills | Status: DC
Start: 2021-01-26 — End: 2022-06-21

## 2021-01-29 ENCOUNTER — Other Ambulatory Visit: Payer: Self-pay | Admitting: Obstetrics and Gynecology

## 2021-01-29 ENCOUNTER — Other Ambulatory Visit: Payer: Self-pay | Admitting: Family Medicine

## 2021-01-29 DIAGNOSIS — R928 Other abnormal and inconclusive findings on diagnostic imaging of breast: Secondary | ICD-10-CM

## 2021-02-01 ENCOUNTER — Ambulatory Visit (INDEPENDENT_AMBULATORY_CARE_PROVIDER_SITE_OTHER): Payer: 59 | Admitting: Obstetrics and Gynecology

## 2021-02-01 ENCOUNTER — Other Ambulatory Visit: Payer: Self-pay | Admitting: Obstetrics and Gynecology

## 2021-02-01 ENCOUNTER — Other Ambulatory Visit (HOSPITAL_COMMUNITY)
Admission: RE | Admit: 2021-02-01 | Discharge: 2021-02-01 | Disposition: A | Discharge status: Home / Self Care | Payer: 59 | Source: Ambulatory Visit | Attending: Obstetrics and Gynecology | Admitting: Obstetrics and Gynecology

## 2021-02-01 ENCOUNTER — Encounter: Payer: Self-pay | Admitting: Obstetrics and Gynecology

## 2021-02-01 ENCOUNTER — Other Ambulatory Visit: Payer: Self-pay

## 2021-02-01 VITALS — BP 130/85 | HR 101 | Ht 64.0 in | Wt 163.7 lb

## 2021-02-01 DIAGNOSIS — N72 Inflammatory disease of cervix uteri: Secondary | ICD-10-CM | POA: Diagnosis not present

## 2021-02-01 DIAGNOSIS — Z01419 Encounter for gynecological examination (general) (routine) without abnormal findings: Secondary | ICD-10-CM | POA: Insufficient documentation

## 2021-02-01 DIAGNOSIS — B977 Papillomavirus as the cause of diseases classified elsewhere: Secondary | ICD-10-CM | POA: Diagnosis not present

## 2021-02-01 NOTE — Addendum Note (Signed)
Addended by: Lady Deutscher on: 02/01/2021 03:32 PM   Modules accepted: Orders

## 2021-02-01 NOTE — Progress Notes (Signed)
HPI:      [name] is a 45 y.o. No obstetric history on file. who LMP was Patient's last menstrual period was 01/23/2021 (approximate).  Subjective:   She presents today for her annual examination.  She has used the medication for both BV and yeast and now feels better. She is a little bit "down" today because she is concerned regarding her mammogram.  She has to go back for additional views. Her last Pap smear showed positive HPV and ASCUS this was followed by a colposcopy.  No cytologic abnormalities were found at colposcopy    Hx: The following portions of the patient's history were reviewed and updated as appropriate:             She  has a past medical history of ADD (attention deficit disorder), Allergy, Anxiety, Asthma, Depression, and Thyroid disease. She does not have any pertinent problems on file. She  has a past surgical history that includes Cholecystectomy and Cholecystectomy. Her family history is not on file. She was adopted. She  reports that she has never smoked. She has never used smokeless tobacco. She reports current alcohol use. She reports that she does not use drugs. She has a current medication list which includes the following prescription(s): albuterol, allegra-d allergy & congestion, clonazepam, escitalopram, fluconazole, fluticasone, gabapentin, metronidazole, metronidazole, multiple vitamin, naproxen, norethindrone-ethinyl estradiol, fish oil, omeprazole, polyethylene glycol powder, topiramate, trazodone, vitamin c, and [DISCONTINUED] bupropion. She is allergic to morphine.       Review of Systems:  Review of Systems  Constitutional: Denied constitutional symptoms, night sweats, recent illness, fatigue, fever, insomnia and weight loss.  Eyes: Denied eye symptoms, eye pain, photophobia, vision change and visual disturbance.  Ears/Nose/Throat/Neck: Denied ear, nose, throat or neck symptoms, hearing loss, nasal discharge, sinus congestion and  sore throat.  Cardiovascular: Denied cardiovascular symptoms, arrhythmia, chest pain/pressure, edema, exercise intolerance, orthopnea and palpitations.  Respiratory: Denied pulmonary symptoms, asthma, pleuritic pain, productive sputum, cough, dyspnea and wheezing.  Gastrointestinal: Denied, gastro-esophageal reflux, melena, nausea and vomiting.  Genitourinary: Denied genitourinary symptoms including symptomatic vaginal discharge, pelvic relaxation issues, and urinary complaints.  Musculoskeletal: Denied musculoskeletal symptoms, stiffness, swelling, muscle weakness and myalgia.  Dermatologic: Denied dermatology symptoms, rash and scar.  Neurologic: Denied neurology symptoms, dizziness, headache, neck pain and syncope.  Psychiatric: Denied psychiatric symptoms, anxiety and depression.  Endocrine: Denied endocrine symptoms including hot flashes and night sweats.   Meds:   Current Outpatient Medications on File Prior to Visit  Medication Sig Dispense Refill   albuterol (VENTOLIN HFA) 108 (90 Base) MCG/ACT inhaler Inhale into the lungs.     ALLEGRA-D ALLERGY & CONGESTION 180-240 MG 24 hr tablet Take 1 tablet by mouth daily.     clonazePAM (KLONOPIN) 1 MG tablet SMARTSIG:1 Tablet(s) By Mouth Every 12 Hours PRN     escitalopram (LEXAPRO) 10 MG tablet 5 mg daily for one week, then 10 mg daily 30 tablet 1   fluconazole (DIFLUCAN) 150 MG tablet Take 1 tablet (150 mg total) by mouth every 3 (three) days. For three doses 3 tablet 3   fluticasone (FLONASE) 50 MCG/ACT nasal spray TWO PUFFS IN EACH NOSTRIL ONCE A DAY 16 g 1   gabapentin (NEURONTIN) 300 MG capsule Take by mouth.     metroNIDAZOLE (FLAGYL) 500 MG tablet Take 1 tablet (500 mg total) by mouth 2 (two) times daily. 14 tablet 0   metroNIDAZOLE (FLAGYL) 500 MG tablet Take 1 tablet (500 mg total) by mouth 2 (two) times  daily. 14 tablet 0   Multiple Vitamins-Minerals (MULTIVITAMIN PO) Take 1 tablet by mouth daily.     naproxen (NAPROSYN) 500 MG  tablet Take by mouth.     norethindrone-ethinyl estradiol (LOESTRIN) 1-20 MG-MCG tablet TAKE 1 TABLET BY MOUTH DAILY 84 tablet 0   Omega-3 Fatty Acids (FISH OIL) 1000 MG CAPS Take 1 capsule by mouth 2 (two) times daily.     omeprazole (PRILOSEC) 20 MG capsule Take 20 mg by mouth daily.     polyethylene glycol powder (GLYCOLAX/MIRALAX) 17 GM/SCOOP powder Take by mouth.     topiramate (TOPAMAX) 50 MG tablet Take 50 mg by mouth 2 (two) times daily.     traZODone (DESYREL) 100 MG tablet      vitamin C (ASCORBIC ACID) 500 MG tablet Take 500 mg by mouth 2 (two) times daily.     [DISCONTINUED] buPROPion (WELLBUTRIN XL) 150 MG 24 hr tablet Take 1 tablet (150 mg total) by mouth daily. 30 tablet 2   No current facility-administered medications on file prior to visit.       Objective:     Vitals:   02/01/21 1333  BP: 130/85  Pulse: (!) 101    Filed Weights   02/01/21 1333  Weight: 163 lb 11.2 oz (74.3 kg)              Physical examination General NAD, Conversant  HEENT Atraumatic; Op clear with mmm.  Normo-cephalic. Pupils reactive. Anicteric sclerae  Thyroid/Neck Smooth without nodularity or enlargement. Normal ROM.  Neck Supple.  Skin No rashes, lesions or ulceration. Normal palpated skin turgor. No nodularity.  Breasts: No masses or discharge.  Symmetric.  No axillary adenopathy.  Lungs: Clear to auscultation.No rales or wheezes. Normal Respiratory effort, no retractions.  Heart: NSR.  No murmurs or rubs appreciated. No periferal edema  Abdomen: Soft.  Non-tender.  No masses.  No HSM. No hernia  Extremities: Moves all appropriately.  Normal ROM for age. No lymphadenopathy.  Neuro: Oriented to PPT.  Normal mood. Normal affect.     Pelvic:   Vulva: Normal appearance.  No lesions.  Vagina: No lesions or abnormalities noted.  Support: Normal pelvic support.  Urethra No masses tenderness or scarring.  Meatus Normal size without lesions or prolapse.  Cervix: Normal appearance.  No  lesions.  Anus: Normal exam.  No lesions.  Perineum: Normal exam.  No lesions.        Bimanual   Uterus: Normal size.  Non-tender.  Mobile.  AV.  Adnexae: No masses.  Non-tender to palpation.  Cul-de-sac: Negative for abnormality.     Assessment:    No obstetric history on file. Patient Active Problem List   Diagnosis Date Noted   Chest pain 10/20/2013   Allergic rhinitis 09/28/2013   Positive urine drug screen 08/16/2013   ADD (attention deficit disorder) 02/23/2013   Migraine with aura 01/13/2013   Anxiety and depression    Allergy      1. Well woman exam with routine gynecological exam   2. High risk human papilloma virus (HPV) infection of cervix     Vaginitis resolved  Additional views needed at mammogram-I have tried to reassure her regarding this but she remains concerned.   Plan:            1.  Basic Screening Recommendations The basic screening recommendations for asymptomatic women were discussed with the patient during her visit.  The age-appropriate recommendations were discussed with her and the rational for the tests reviewed.  When I am informed by the patient that another primary care physician has previously obtained the age-appropriate tests and they are up-to-date, only outstanding tests are ordered and referrals given as necessary.  Abnormal results of tests will be discussed with her when all of her results are completed.  Routine preventative health maintenance measures emphasized: Exercise/Diet/Weight control, Tobacco Warnings, Alcohol/Substance use risks and Stress Management Pap performed because of history of positive HPV. -Labs ordered -patient will return for additional views on mammogram. Orders No orders of the defined types were placed in this encounter.   No orders of the defined types were placed in this encounter.           F/U  No follow-ups on file.  Elonda Husky, M.D. 02/01/2021 2:06 PM

## 2021-02-01 NOTE — Addendum Note (Signed)
Addended by: Silvano Bilis on: 02/01/2021 02:57 PM   Modules accepted: Orders

## 2021-02-03 ENCOUNTER — Other Ambulatory Visit: Payer: Self-pay

## 2021-02-03 ENCOUNTER — Ambulatory Visit
Admission: RE | Admit: 2021-02-03 | Discharge: 2021-02-03 | Disposition: A | Discharge status: Home / Self Care | Payer: 59 | Source: Ambulatory Visit | Attending: Family Medicine | Admitting: Family Medicine

## 2021-02-03 ENCOUNTER — Other Ambulatory Visit: Payer: Self-pay | Admitting: Obstetrics and Gynecology

## 2021-02-03 DIAGNOSIS — N6489 Other specified disorders of breast: Secondary | ICD-10-CM

## 2021-02-03 DIAGNOSIS — R928 Other abnormal and inconclusive findings on diagnostic imaging of breast: Secondary | ICD-10-CM

## 2021-02-06 ENCOUNTER — Other Ambulatory Visit: Payer: Self-pay

## 2021-02-06 DIAGNOSIS — N898 Other specified noninflammatory disorders of vagina: Secondary | ICD-10-CM

## 2021-02-06 LAB — CYTOLOGY - PAP
Diagnosis: NEGATIVE
Diagnosis: REACTIVE

## 2021-02-07 ENCOUNTER — Ambulatory Visit
Admission: RE | Admit: 2021-02-07 | Discharge: 2021-02-07 | Disposition: A | Discharge status: Home / Self Care | Payer: 59 | Source: Ambulatory Visit | Attending: Obstetrics and Gynecology | Admitting: Obstetrics and Gynecology

## 2021-02-07 ENCOUNTER — Other Ambulatory Visit: Payer: Self-pay

## 2021-02-07 DIAGNOSIS — N6489 Other specified disorders of breast: Secondary | ICD-10-CM

## 2021-02-07 MED ORDER — NORETHINDRONE ACET-ETHINYL EST 1-20 MG-MCG PO TABS: 1.0000 | ORAL_TABLET | Freq: Every day | ORAL | 3 refills | Status: DC

## 2021-02-08 NOTE — Progress Notes (Deleted)
BH MD/PA/NP OP Progress Note  02/08/2021 3:12 PM Joanna Walker  MRN:  [id number]  Chief Complaint:  HPI: *** Visit Diagnosis: No diagnosis found.  Past Psychiatric History: Please see initial evaluation for full details. I have reviewed the history. No updates at this time.     Past Medical History:  Past Medical History:  Diagnosis Date   ADD (attention deficit disorder)    Allergy    Anxiety    Asthma    Depression    Thyroid disease     Past Surgical History:  Procedure Laterality Date   CHOLECYSTECTOMY     CHOLECYSTECTOMY      Family Psychiatric History: Please see initial evaluation for full details. I have reviewed the history. No updates at this time.     Family History:  Family History  Adopted: Yes    Social History:  Social History   Socioeconomic History   Marital status: Legally Separated    Spouse name: Not on file   Number of children: Not on file   Years of education: Not on file   Highest education level: Not on file  Occupational History   Not on file  Tobacco Use   Smoking status: Never   Smokeless tobacco: Never  Substance and Sexual Activity   Alcohol use: Yes    Comment: rare   Drug use: No   Sexual activity: Not on file  Other Topics Concern   Not on file  Social History Narrative   Not on file   Social Determinants of Health   Financial Resource Strain: Not on file  Food Insecurity: Not on file  Transportation Needs: Not on file  Physical Activity: Not on file  Stress: Not on file  Social Connections: Not on file    Allergies:  Allergies  Allergen Reactions   Morphine Nausea And Vomiting    Metabolic Disorder Labs: No results found for: HGBA1C, MPG No results found for: PROLACTIN Lab Results  Component Value Date   CHOL 185 06/28/2013   TRIG 221.0 (H) 06/28/2013   HDL 44.40 06/28/2013   CHOLHDL 4 06/28/2013   VLDL 44.2 (H) 06/28/2013   Lab Results  Component Value Date   TSH 0.85 06/28/2013     Therapeutic Level Labs: No results found for: LITHIUM No results found for: VALPROATE No components found for:  CBMZ  Current Medications: Current Outpatient Medications  Medication Sig Dispense Refill   albuterol (VENTOLIN HFA) 108 (90 Base) MCG/ACT inhaler Inhale into the lungs.     ALLEGRA-D ALLERGY & CONGESTION 180-240 MG 24 hr tablet Take 1 tablet by mouth daily.     clonazePAM (KLONOPIN) 1 MG tablet SMARTSIG:1 Tablet(s) By Mouth Every 12 Hours PRN     escitalopram (LEXAPRO) 10 MG tablet 5 mg daily for one week, then 10 mg daily 30 tablet 1   fluconazole (DIFLUCAN) 150 MG tablet Take 1 tablet (150 mg total) by mouth every 3 (three) days. For three doses 3 tablet 3   fluticasone (FLONASE) 50 MCG/ACT nasal spray TWO PUFFS IN EACH NOSTRIL ONCE A DAY 16 g 1   gabapentin (NEURONTIN) 300 MG capsule Take by mouth.     metroNIDAZOLE (FLAGYL) 500 MG tablet Take 1 tablet (500 mg total) by mouth 2 (two) times daily. 14 tablet 0   metroNIDAZOLE (FLAGYL) 500 MG tablet Take 1 tablet (500 mg total) by mouth 2 (two) times daily. 14 tablet 0   Multiple Vitamins-Minerals (MULTIVITAMIN PO) Take 1 tablet by mouth daily.  naproxen (NAPROSYN) 500 MG tablet Take by mouth.     norethindrone-ethinyl estradiol (LOESTRIN) 1-20 MG-MCG tablet Take 1 tablet by mouth daily. 84 tablet 3   Omega-3 Fatty Acids (FISH OIL) 1000 MG CAPS Take 1 capsule by mouth 2 (two) times daily.     omeprazole (PRILOSEC) 20 MG capsule Take 20 mg by mouth daily.     polyethylene glycol powder (GLYCOLAX/MIRALAX) 17 GM/SCOOP powder Take by mouth.     topiramate (TOPAMAX) 50 MG tablet Take 50 mg by mouth 2 (two) times daily.     traZODone (DESYREL) 100 MG tablet      vitamin C (ASCORBIC ACID) 500 MG tablet Take 500 mg by mouth 2 (two) times daily.     No current facility-administered medications for this visit.     Musculoskeletal: Strength & Muscle Tone:  N/A Gait & Station:  N/A Patient leans: N/A  Psychiatric Specialty  Exam: Review of Systems  Last menstrual period 01/23/2021.There is no height or weight on file to calculate BMI.  General Appearance: {Appearance:22683}  Eye Contact:  {BHH EYE CONTACT:22684}  Speech:  Clear and Coherent  Volume:  Normal  Mood:  {BHH MOOD:22306}  Affect:  {Affect (PAA):22687}  Thought Process:  Coherent  Orientation:  Full (Time, Place, and Person)  Thought Content: Logical   Suicidal Thoughts:  {ST/HT (PAA):22692}  Homicidal Thoughts:  {ST/HT (PAA):22692}  Memory:  Immediate;   Good  Judgement:  {Judgement (PAA):22694}  Insight:  {Insight (PAA):22695}  Psychomotor Activity:  Normal  Concentration:  Concentration: Good and Attention Span: Good  Recall:  Good  Fund of Knowledge: Good  Language: Good  Akathisia:  No  Handed:  Right  AIMS (if indicated): not done  Assets:  Communication Skills Desire for Improvement  ADL's:  Intact  Cognition: WNL  Sleep:  {BHH GOOD/FAIR/POOR:22877}   Screenings: PHQ2-9    Flowsheet Row Video Visit from 10/05/2020 in [facility] Video Visit from 09/07/2020 in [facility] Office Visit from 06/28/2013 in [facility] [place]  PHQ-2 Total Score 1 2 0  PHQ-9 Total Score -- 8 --      Flowsheet Row Video Visit from 09/07/2020 in [facility]  C-SSRS RISK CATEGORY No Risk        Assessment and Plan:  Joanna Walker is a 45 y.o. year old female with a history of ADHD, PTSD, depression,  migraine, who presents for follow up appointment for below.   1. PTSD (post-traumatic stress disorder) 2. Mild episode of recurrent major depressive disorder (HCC) She continues to report depressive symptoms and an anxiety in the context of break-up.  She has trauma, and reports issues with trust.  She will start a new job next Monday, and is future oriented.  Although she had a good benefit from Abilify, she has noticed weight gain.   Will discontinue this medication.  Will plan to switch from fluoxetine to Lexapro to optimize treatment for PTSD and depression. She will greatly benefit from CBT; will make referral.    # Insomnia Unchanged. She reports snoring, fatigue, and occasional insomnia.  Made referral for evaluation of sleep apnea.    Plan Discontinue fluoxetine  Start lexapro 5 mg daily for one week, then 10 mg daily Discontinue Abilify 4.   Referral to therapy  5. Next appointment- 8/26 at 11 AM, video -  on adzenys 18.8, adderall xr 25, clonazepam 1 mg BID, prescribed by [place] attention specialist -  On gabapentin  300 mg TID, topiramate 50 mg for migraine - check TSH if that is not done at the next visit   Addendum: the original plan was to start venlafaxine instead of lexapro. Lexapro is likely preferred after having reviewed the dosage of stimulant she is on.  This clinician called and left the voice message to continue current medication at this time (fluoxetine for depression, PTSD, and Abilify for depression) with plan to discuss the above change on Monday.    Past trials of medication: lithium (had some negative reaction)   The patient demonstrates the following risk factors for suicide: Chronic risk factors for suicide include: psychiatric disorder of depression, anxiety. Acute risk factors for suicide include: loss (financial, interpersonal, professional). Protective factors for this patient include: positive therapeutic relationship, coping skills and hope for the future. Considering these factors, the overall suicide risk at this point appears to be low. Patient is appropriate for outpatient follow up.      Neysa Hotter, MD 02/08/2021, 3:12 PM

## 2021-02-09 ENCOUNTER — Other Ambulatory Visit: Payer: Self-pay

## 2021-02-09 ENCOUNTER — Telehealth: Payer: Self-pay | Admitting: Psychiatry

## 2021-02-09 ENCOUNTER — Telehealth: Payer: Commercial Managed Care - HMO | Admitting: Psychiatry

## 2021-02-09 NOTE — Telephone Encounter (Signed)
Sent link for video visit through Epic. Patient did not sign in. Called the patient for appointment scheduled today. The patient did not answer the phone. Left voice message to contact the office ([phone]).   ?

## 2021-03-07 ENCOUNTER — Other Ambulatory Visit: Payer: Self-pay | Admitting: Obstetrics and Gynecology

## 2021-03-07 DIAGNOSIS — N898 Other specified noninflammatory disorders of vagina: Secondary | ICD-10-CM

## 2021-03-07 MED ORDER — METRONIDAZOLE 500 MG PO TABS: 500.0000 mg | ORAL_TABLET | Freq: Two times a day (BID) | ORAL | 0 refills | Status: DC

## 2021-03-07 NOTE — Addendum Note (Signed)
Addended by: Tommie Raymond on: 03/07/2021 03:04 PM   Modules accepted: Orders

## 2021-05-15 ENCOUNTER — Telehealth: Payer: Commercial Managed Care - HMO | Admitting: Physician Assistant

## 2021-05-15 DIAGNOSIS — B9789 Other viral agents as the cause of diseases classified elsewhere: Secondary | ICD-10-CM | POA: Diagnosis not present

## 2021-05-15 DIAGNOSIS — J019 Acute sinusitis, unspecified: Secondary | ICD-10-CM | POA: Diagnosis not present

## 2021-05-15 MED ORDER — IPRATROPIUM BROMIDE 0.03 % NA SOLN: 2.0000 | Freq: Two times a day (BID) | NASAL | 0 refills | Status: DC

## 2021-05-15 NOTE — Progress Notes (Signed)
I have spent 5 minutes in review of e-visit questionnaire, review and updating patient chart, medical decision making and response to patient.   Yanis Juma Cody Faithlyn Recktenwald, PA-C    

## 2021-05-15 NOTE — Progress Notes (Signed)
E-Visit for Sinus Problems  We are sorry that you are not feeling well.  Here is how we plan to help!  Based on what you have shared with me it looks like you have sinusitis.  Sinusitis is inflammation and infection in the sinus cavities of the head.  Based on your presentation I believe you most likely have Acute Viral Sinusitis.This is an infection most likely caused by a virus. There is not specific treatment for viral sinusitis other than to help you with the symptoms until the infection runs its course.  You may use an oral decongestant such as Mucinex D or if you have glaucoma or high blood pressure use plain Mucinex. Saline nasal spray help and can safely be used as often as needed for congestion, I have prescribed: Ipratropium Bromide nasal spray 0.03% 2 sprays in eah nostril 2-3 times a day. Take this along with your Flonase.   Some authorities believe that zinc sprays or the use of Echinacea may shorten the course of your symptoms.  Sinus infections are not as easily transmitted as other respiratory infection, however we still recommend that you avoid close contact with loved ones, especially the very young and elderly.  Remember to wash your hands thoroughly throughout the day as this is the number one way to prevent the spread of infection!  Home Care: Only take medications as instructed by your medical team. Do not take these medications with alcohol. A steam or ultrasonic humidifier can help congestion.  You can place a towel over your head and breathe in the steam from hot water coming from a faucet. Avoid close contacts especially the very young and the elderly. Cover your mouth when you cough or sneeze. Always remember to wash your hands.  Get Help Right Away If: You develop worsening fever or sinus pain. You develop a severe head ache or visual changes. Your symptoms persist after you have completed your treatment plan.  Make sure you Understand these instructions. Will  watch your condition. Will get help right away if you are not doing well or get worse.   Thank you for choosing an e-visit.  Your e-visit answers were reviewed by a board certified advanced clinical practitioner to complete your personal care plan. Depending upon the condition, your plan could have included both over the counter or prescription medications.  Please review your pharmacy choice. Make sure the pharmacy is open so you can pick up prescription now. If there is a problem, you may contact your provider through [employer] and have the prescription routed to another pharmacy.  Your safety is important to [place]. If you have drug allergies check your prescription carefully.   For the next 24 hours you can use MyChart to ask questions about today's visit, request a non-urgent call back, or ask for a work or school excuse. You will get an email in the next two days asking about your experience. I hope that your e-visit has been valuable and will speed your recovery.

## 2021-05-16 DIAGNOSIS — Z0000 Encounter for general adult medical examination without abnormal findings: Secondary | ICD-10-CM | POA: Diagnosis not present

## 2021-05-16 DIAGNOSIS — J111 Influenza due to unidentified influenza virus with other respiratory manifestations: Secondary | ICD-10-CM | POA: Diagnosis not present

## 2021-05-23 ENCOUNTER — Other Ambulatory Visit: Payer: Self-pay | Admitting: Obstetrics and Gynecology

## 2021-05-23 DIAGNOSIS — N898 Other specified noninflammatory disorders of vagina: Secondary | ICD-10-CM

## 2021-06-23 ENCOUNTER — Other Ambulatory Visit: Payer: Self-pay | Admitting: Obstetrics and Gynecology

## 2021-06-23 ENCOUNTER — Ambulatory Visit
Admission: RE | Admit: 2021-06-23 | Discharge: 2021-06-23 | Disposition: A | Discharge status: Home / Self Care | Payer: Commercial Managed Care - HMO | Source: Ambulatory Visit | Attending: Student | Admitting: Student

## 2021-06-23 ENCOUNTER — Ambulatory Visit
Admission: RE | Admit: 2021-06-23 | Discharge: 2021-06-23 | Disposition: A | Discharge status: Home / Self Care | Payer: Commercial Managed Care - HMO | Source: Home / Self Care | Attending: *Deleted | Admitting: *Deleted

## 2021-06-23 ENCOUNTER — Other Ambulatory Visit: Payer: Self-pay

## 2021-06-23 ENCOUNTER — Other Ambulatory Visit: Payer: Self-pay | Admitting: Student

## 2021-06-23 DIAGNOSIS — M7989 Other specified soft tissue disorders: Secondary | ICD-10-CM | POA: Insufficient documentation

## 2021-08-09 ENCOUNTER — Other Ambulatory Visit: Payer: Self-pay | Admitting: Psychiatry

## 2021-08-23 ENCOUNTER — Telehealth: Payer: Self-pay | Admitting: Obstetrics and Gynecology

## 2021-08-23 NOTE — Telephone Encounter (Signed)
Patient states having abnormal bleeding starting for two weeks. Patient states the bleeding is a mixture of spotting and heavy dark color.  Patient states moderate abdominal pain and cramping. Patient states taking birth control daily and typically does not have a period. Patient not had any recent surgeries. She also states a concern of a bacterial infection, states odor and itching. She states using boric acid suppositories three times. Advised patient to make an appointment, transferred to front office. Patient voiced understanding.  ?

## 2021-08-23 NOTE — Telephone Encounter (Signed)
Pt called stating that she has been having pain and bleeding for 2 weeks that is abnormal- she is consistent with her birth control- pt states that she think she may have a bacterial infection. Please advise.  ?

## 2021-08-24 ENCOUNTER — Encounter: Payer: Managed Care, Other (non HMO) | Admitting: Obstetrics and Gynecology

## 2021-09-07 ENCOUNTER — Encounter: Payer: Managed Care, Other (non HMO) | Admitting: Obstetrics and Gynecology

## 2021-09-07 DIAGNOSIS — N939 Abnormal uterine and vaginal bleeding, unspecified: Secondary | ICD-10-CM

## 2021-10-22 ENCOUNTER — Other Ambulatory Visit: Payer: Self-pay

## 2021-10-22 ENCOUNTER — Emergency Department: Payer: Commercial Managed Care - HMO

## 2021-10-22 ENCOUNTER — Emergency Department
Admission: EM | Admit: 2021-10-22 | Discharge: 2021-10-23 | Disposition: A | Discharge status: Home / Self Care | Payer: Commercial Managed Care - HMO | Attending: Emergency Medicine | Admitting: Emergency Medicine

## 2021-10-22 DIAGNOSIS — J45909 Unspecified asthma, uncomplicated: Secondary | ICD-10-CM | POA: Insufficient documentation

## 2021-10-22 DIAGNOSIS — R109 Unspecified abdominal pain: Secondary | ICD-10-CM | POA: Diagnosis present

## 2021-10-22 DIAGNOSIS — N200 Calculus of kidney: Secondary | ICD-10-CM | POA: Insufficient documentation

## 2021-10-22 DIAGNOSIS — R7989 Other specified abnormal findings of blood chemistry: Secondary | ICD-10-CM | POA: Diagnosis not present

## 2021-10-22 DIAGNOSIS — N23 Unspecified renal colic: Secondary | ICD-10-CM | POA: Insufficient documentation

## 2021-10-22 LAB — CBC
HCT: 45 % (ref 36.0–46.0)
Hemoglobin: 15.5 g/dL — ABNORMAL HIGH (ref 12.0–15.0)
MCH: 29.1 pg (ref 26.0–34.0)
MCHC: 34.4 g/dL (ref 30.0–36.0)
MCV: 84.6 fL (ref 80.0–100.0)
Platelets: 335 10*3/uL (ref 150–400)
RBC: 5.32 MIL/uL — ABNORMAL HIGH (ref 3.87–5.11)
RDW: 13.2 % (ref 11.5–15.5)
WBC: 10.9 10*3/uL — ABNORMAL HIGH (ref 4.0–10.5)
nRBC: 0 % (ref 0.0–0.2)

## 2021-10-22 LAB — PREGNANCY, URINE: Preg Test, Ur: NEGATIVE

## 2021-10-22 LAB — URINALYSIS, ROUTINE W REFLEX MICROSCOPIC
Bacteria, UA: NONE SEEN
Bilirubin Urine: NEGATIVE
Glucose, UA: NEGATIVE mg/dL
Ketones, ur: 5 mg/dL — AB
Leukocytes,Ua: NEGATIVE
Nitrite: NEGATIVE
Protein, ur: 30 mg/dL — AB
RBC / HPF: >50 RBC/hpf — ABNORMAL HIGH (ref 0–5)
Specific Gravity, Urine: 1.023 (ref 1.005–1.030)
pH: 5 (ref 5.0–8.0)

## 2021-10-22 LAB — BASIC METABOLIC PANEL
Anion gap: 5 (ref 5–15)
BUN: 14 mg/dL (ref 6–20)
CO2: 22 mmol/L (ref 22–32)
Calcium: 9.1 mg/dL (ref 8.9–10.3)
Chloride: 111 mmol/L (ref 98–111)
Creatinine, Ser: 1.4 mg/dL — ABNORMAL HIGH (ref 0.44–1.00)
GFR, Estimated: 47 mL/min — ABNORMAL LOW (ref >60)
Glucose, Bld: 108 mg/dL — ABNORMAL HIGH (ref 70–99)
Potassium: 3.8 mmol/L (ref 3.5–5.1)
Sodium: 138 mmol/L (ref 135–145)

## 2021-10-22 MED ORDER — OXYCODONE-ACETAMINOPHEN 5-325 MG PO TABS
1.0000 | ORAL_TABLET | ORAL | Status: DC | PRN
  Administered 2021-10-22: 1 via ORAL
  Filled 2021-10-22: qty 1

## 2021-10-22 MED ORDER — ONDANSETRON 4 MG PO TBDP
8.0000 mg | ORAL_TABLET | Freq: Once | ORAL | Status: AC
  Administered 2021-10-22: 8 mg via ORAL
  Filled 2021-10-22: qty 2

## 2021-10-22 NOTE — ED Triage Notes (Signed)
Pt has pain in her left flank and pain and pressure in vaginal area. Pt has had some nausea today.  ? ?

## 2021-10-23 MED ORDER — IBUPROFEN 800 MG PO TABS
800.0000 mg | ORAL_TABLET | Freq: Once | ORAL | Status: AC
  Administered 2021-10-23: 800 mg via ORAL
  Filled 2021-10-23: qty 1

## 2021-10-23 NOTE — Discharge Instructions (Addendum)
As we discussed, your kidney labs are abnormal and I recommend that you see a primary care in 7 days for repeat creatinine level to make sure you are not developing kidney disease. ?

## 2021-10-23 NOTE — ED Provider Notes (Signed)
? ?[facility] ?Provider Note ? ? ? Event Date/Time  ? First MD Initiated Contact with Patient 10/23/21 0004   ?  (approximate) ? ? ?History  ? ?Flank Pain ? ? ?HPI ? ?Joanna Walker is a 46 y.o. female with a history of asthma, anxiety, depression who presents for evaluation of left-sided flank pain.  Patient reports sudden onset of severe sharp left-sided flank pain radiating down to her left abdomen and pelvic area.  Associated with nausea but no vomiting.  No dysuria or hematuria.  Patient reports the pain has now subsided but the pain was severe at home.  Patient has had cholecystectomy but no other abdominal surgeries.  No chest pain or shortness of breath ?  ? ? ?Past Medical History:  ?Diagnosis Date  ? ADD (attention deficit disorder)   ? Allergy   ? Anxiety   ? Asthma   ? Depression   ? ? ?Past Surgical History:  ?Procedure Laterality Date  ? CHOLECYSTECTOMY    ? CHOLECYSTECTOMY    ? ? ? ?Physical Exam  ? ?Triage Vital Signs: ?ED Triage Vitals  ?Enc Vitals Group  ?   BP 10/22/21 2111 (!) 127/105  ?   Pulse Rate 10/22/21 2111 95  ?   Resp 10/22/21 2111 19  ?   Temp 10/22/21 2111 98.4 ?F (36.9 ?C)  ?   Temp Source 10/22/21 2111 Oral  ?   SpO2 10/22/21 2111 96 %  ?   Weight 10/22/21 2112 137 lb (62.1 kg)  ?   Height 10/22/21 2112 5\' 2"  (1.575 m)  ?   Head Circumference --   ?   Peak Flow --   ?   Pain Score 10/22/21 2112 10  ?   Pain Loc --   ?   Pain Edu? --   ?   Excl. in GC? --   ? ? ?Most recent vital signs: ?Vitals:  ? 10/22/21 2111 10/23/21 0001  ?BP: (!) 127/105 110/78  ?Pulse: 95 87  ?Resp: 19 19  ?Temp: 98.4 ?F (36.9 ?C) 98.1 ?F (36.7 ?C)  ?SpO2: 96% 96%  ? ? ? ?Constitutional: Alert and oriented. Well appearing and in no apparent distress. ?HEENT: ?     Head: Normocephalic and atraumatic.    ?     Eyes: Conjunctivae are normal. Sclera is non-icteric.  ?     Mouth/Throat: Mucous membranes are moist.  ?     Neck: Supple with no signs of meningismus. ?Cardiovascular:  Regular rate and rhythm. No murmurs, gallops, or rubs. 2+ symmetrical distal pulses are present in all extremities.  ?Respiratory: Normal respiratory effort. Lungs are clear to auscultation bilaterally.  ?Gastrointestinal: Soft, non tender, and non distended with positive bowel sounds. No rebound or guarding. ?Genitourinary: No CVA tenderness. ?Musculoskeletal:  No edema, cyanosis, or erythema of extremities. ?Neurologic: Normal speech and language. Face is symmetric. Moving all extremities. No gross focal neurologic deficits are appreciated. ?Skin: Skin is warm, dry and intact. No rash noted. ?Psychiatric: Mood and affect are normal. Speech and behavior are normal. ? ?ED Results / Procedures / Treatments  ? ?Labs ?(all labs ordered are listed, but only abnormal results are displayed) ?Labs Reviewed  ?URINALYSIS, ROUTINE W REFLEX MICROSCOPIC - Abnormal; Notable for the following components:  ?    Result Value  ? Color, Urine YELLOW (*)   ? APPearance HAZY (*)   ? Hgb urine dipstick LARGE (*)   ? Ketones, ur 5 (*)   ?  Protein, ur 30 (*)   ? RBC / HPF >50 (*)   ? All other components within normal limits  ?BASIC METABOLIC PANEL - Abnormal; Notable for the following components:  ? Glucose, Bld 108 (*)   ? Creatinine, Ser 1.40 (*)   ? GFR, Estimated 47 (*)   ? All other components within normal limits  ?CBC - Abnormal; Notable for the following components:  ? WBC 10.9 (*)   ? RBC 5.32 (*)   ? Hemoglobin 15.5 (*)   ? All other components within normal limits  ?URINE CULTURE  ?PREGNANCY, URINE  ? ? ? ?EKG ? ?none ? ? ?RADIOLOGY ?I, Nita Sicklearolina Lajuana Patchell, attending MD, have personally viewed and interpreted the images obtained during this visit as below: ? ?CT showing a 2 mm stone in the bladder ? ? ?___________________________________________________ ?Interpretation by Radiologist:  ?CT Renal Stone Study ? ?Result Date: 10/22/2021 ?CLINICAL DATA:  Left flank pain EXAM: CT ABDOMEN AND PELVIS WITHOUT CONTRAST TECHNIQUE:  Multidetector CT imaging of the abdomen and pelvis was performed following the standard protocol without IV contrast. RADIATION DOSE REDUCTION: This exam was performed according to the departmental dose-optimization program which includes automated exposure control, adjustment of the mA and/or kV according to patient size and/or use of iterative reconstruction technique. COMPARISON:  02/10/2017 FINDINGS: Lower chest: No acute abnormality Hepatobiliary: No focal liver abnormality is seen. Status post cholecystectomy. No biliary dilatation. Pancreas: No focal abnormality or ductal dilatation. Spleen: No focal abnormality.  Normal size. Adrenals/Urinary Tract: 2 mm stone layering dependently in the bladder. No ureteral stones or hydronephrosis. No renal stones. No renal or adrenal mass. Stomach/Bowel: Normal appendix. Stomach, large and small bowel grossly unremarkable. Vascular/Lymphatic: No evidence of aneurysm or adenopathy. Reproductive: Uterus and adnexa unremarkable.  No mass. Other: No free fluid or free air. Musculoskeletal: No acute bony abnormality. IMPRESSION: 2 mm stone layering dependently within the urinary bladder. No ureteral stones or hydronephrosis. No acute findings. Electronically Signed   By: Charlett NoseKevin  Dover M.D.   On: 10/22/2021 22:46   ? ? ? ? ?PROCEDURES: ? ?Critical Care performed: No ? ?Procedures ? ? ? ?IMPRESSION / MDM / ASSESSMENT AND PLAN / ED COURSE  ?I reviewed the triage vital signs and the nursing notes. ? ?46 y.o. female with a history of asthma, anxiety, depression who presents for evaluation of left-sided flank pain.  Patient is pain-free at this time.  Vitals are within normal limits.  Exam is nonfocal.  CT renal showed a passed kidney stone that is now in her bladder.  There is no signs of overlying UTI.  Pain has fully resolved.  Labs showing mild AKI with a creatinine of 1.40.  We do not have labs for patient since 2018.  Unclear if this is a new diagnosis of chronic kidney disease  or a mild AKI in the setting of a kidney stone.  Recommended repeat blood work in a week with her primary care doctor for further evaluation.  The meantime recommend increase oral hydration at home.  We did discuss dietary changes for kidney stones and follow-up with primary care doctor. ? ? ?MEDICATIONS GIVEN IN ED: ?Medications  ?oxyCODONE-acetaminophen (PERCOCET/ROXICET) 5-325 MG per tablet 1 tablet (1 tablet Oral Given 10/22/21 2133)  ?ondansetron (ZOFRAN-ODT) disintegrating tablet 8 mg (8 mg Oral Given 10/22/21 2133)  ?ibuprofen (ADVIL) tablet 800 mg (800 mg Oral Given 10/23/21 0047)  ? ? ?EMR reviewed including records from her last visit with her primary care doctor for sinusitis from  November 2022 ? ? ? ?FINAL CLINICAL IMPRESSION(S) / ED DIAGNOSES  ? ?Final diagnoses:  ?Renal colic  ?Kidney stone  ?Abnormal blood creatinine level  ? ? ? ?Rx / DC Orders  ? ?ED Discharge Orders   ? ?      Ordered  ?  Ambulatory referral to Internal Medicine       ?Comments: Abnormal creatinine  ? 10/23/21 0015  ? ?  ?  ? ?  ? ? ? ?Note:  This document was prepared using Dragon voice recognition software and may include unintentional dictation errors. ? ? ?Please note:  Patient was evaluated in Emergency Department today for the symptoms described in the history of present illness. Patient was evaluated in the context of the global COVID-19 pandemic, which necessitated consideration that the patient might be at risk for infection with the SARS-CoV-2 virus that causes COVID-19. Institutional protocols and algorithms that pertain to the evaluation of patients at risk for COVID-19 are in a state of rapid change based on information released by regulatory bodies including the CDC and federal and state organizations. These policies and algorithms were followed during the patient's care in the ED.  Some ED evaluations and interventions may be delayed as a result of limited staffing during the pandemic. ? ? ? ? ?  ?Nita Sickle,  MD ?10/23/21 0104 ? ?

## 2021-10-24 LAB — URINE CULTURE

## 2021-11-21 ENCOUNTER — Ambulatory Visit: Payer: 59 | Admitting: Nurse Practitioner

## 2021-12-13 ENCOUNTER — Ambulatory Visit (HOSPITAL_COMMUNITY): Payer: Self-pay | Admitting: Psychiatry

## 2021-12-13 ENCOUNTER — Encounter (HOSPITAL_COMMUNITY): Payer: Self-pay

## 2021-12-26 ENCOUNTER — Ambulatory Visit: Payer: 59 | Admitting: Nurse Practitioner

## 2022-01-09 ENCOUNTER — Other Ambulatory Visit: Payer: Self-pay | Admitting: Obstetrics and Gynecology

## 2022-01-09 ENCOUNTER — Other Ambulatory Visit: Payer: Self-pay | Admitting: Internal Medicine

## 2022-01-09 DIAGNOSIS — Z1231 Encounter for screening mammogram for malignant neoplasm of breast: Secondary | ICD-10-CM

## 2022-02-05 ENCOUNTER — Ambulatory Visit: Payer: 59

## 2022-02-05 ENCOUNTER — Encounter: Payer: 59 | Admitting: Obstetrics and Gynecology

## 2022-02-05 DIAGNOSIS — Z01419 Encounter for gynecological examination (general) (routine) without abnormal findings: Secondary | ICD-10-CM

## 2022-02-12 ENCOUNTER — Ambulatory Visit: Payer: Commercial Managed Care - HMO | Admitting: Psychiatry

## 2022-02-22 ENCOUNTER — Other Ambulatory Visit: Payer: Self-pay | Admitting: Obstetrics and Gynecology

## 2022-02-22 DIAGNOSIS — Z79899 Other long term (current) drug therapy: Secondary | ICD-10-CM | POA: Diagnosis not present

## 2022-02-22 DIAGNOSIS — F338 Other recurrent depressive disorders: Secondary | ICD-10-CM | POA: Diagnosis not present

## 2022-02-22 DIAGNOSIS — F902 Attention-deficit hyperactivity disorder, combined type: Secondary | ICD-10-CM | POA: Diagnosis not present

## 2022-02-22 DIAGNOSIS — F419 Anxiety disorder, unspecified: Secondary | ICD-10-CM | POA: Diagnosis not present

## 2022-02-28 DIAGNOSIS — F902 Attention-deficit hyperactivity disorder, combined type: Secondary | ICD-10-CM | POA: Diagnosis not present

## 2022-02-28 DIAGNOSIS — Z79899 Other long term (current) drug therapy: Secondary | ICD-10-CM | POA: Diagnosis not present

## 2022-03-07 DIAGNOSIS — M5459 Other low back pain: Secondary | ICD-10-CM | POA: Diagnosis not present

## 2022-03-22 NOTE — Progress Notes (Deleted)
There were no vitals taken for this visit.   Subjective:    Patient ID: Joanna Walker, female    DOB: [birth year], 46 y.o.   MRN: [id number]  HPI: Joanna Walker is a 46 y.o. female  No chief complaint on file.  Patient presents to clinic to establish care with new PCP.  Introduced to Designer, jewellery role and practice setting.  All questions answered.  Discussed provider/patient relationship and expectations.  Patient reports a history of ***. Patient denies a history of: Hypertension, Elevated Cholesterol, Diabetes, Thyroid problems, Depression, Anxiety, Neurological problems, and Abdominal problems.   Active Ambulatory Problems    Diagnosis Date Noted   Anxiety and depression    Allergy    Migraine with aura 01/13/2013   ADD (attention deficit disorder) 02/23/2013   Positive urine drug screen 08/16/2013   Allergic rhinitis 09/28/2013   Chest pain 10/20/2013   Resolved Ambulatory Problems    Diagnosis Date Noted   Routine general medical examination at a health care facility 07/02/2012   Contraception management 07/02/2012   Chest pain 06/25/2013   Past Medical History:  Diagnosis Date   Anxiety    Asthma    Depression    Past Surgical History:  Procedure Laterality Date   CHOLECYSTECTOMY     CHOLECYSTECTOMY     Family History  Adopted: Yes     Review of Systems  Per HPI unless specifically indicated above     Objective:    There were no vitals taken for this visit.  Wt Readings from Last 3 Encounters:  10/22/21 137 lb (62.1 kg)  02/01/21 163 lb 11.2 oz (74.3 kg)  01/24/21 166 lb 12.8 oz (75.7 kg)    Physical Exam  Results for orders placed or performed during the hospital encounter of 10/22/21  Urine Culture   Specimen: Urine, Clean Catch  Result Value Ref Range   Specimen Description      URINE, CLEAN CATCH Performed at [facility], [address], [place], [place] [address]    Special Requests       NONE Performed at [facility], [address], [place], [place] [address]    Culture MULTIPLE SPECIES PRESENT, SUGGEST RECOLLECTION (A)    Report Status 10/24/2021 FINAL   Urinalysis, Routine w reflex microscopic  Result Value Ref Range   Color, Urine YELLOW (A) YELLOW   APPearance HAZY (A) CLEAR   Specific Gravity, Urine 1.023 1.005 - 1.030   pH 5.0 5.0 - 8.0   Glucose, UA NEGATIVE NEGATIVE mg/dL   Hgb urine dipstick LARGE (A) NEGATIVE   Bilirubin Urine NEGATIVE NEGATIVE   Ketones, ur 5 (A) NEGATIVE mg/dL   Protein, ur 30 (A) NEGATIVE mg/dL   Nitrite NEGATIVE NEGATIVE   Leukocytes,Ua NEGATIVE NEGATIVE   RBC / HPF >50 (H) 0 - 5 RBC/hpf   WBC, UA 11-20 0 - 5 WBC/hpf   Bacteria, UA NONE SEEN NONE SEEN   Squamous Epithelial / LPF 0-5 0 - 5   Mucus PRESENT   Basic metabolic panel  Result Value Ref Range   Sodium 138 135 - 145 mmol/L   Potassium 3.8 3.5 - 5.1 mmol/L   Chloride 111 98 - 111 mmol/L   CO2 22 22 - 32 mmol/L   Glucose, Bld 108 (H) 70 - 99 mg/dL   BUN 14 6 - 20 mg/dL   Creatinine, Ser 1.40 (H) 0.44 - 1.00 mg/dL   Calcium 9.1 8.9 - 10.3 mg/dL   GFR,  Estimated 47 (L) >60 mL/min   Anion gap 5 5 - 15  CBC  Result Value Ref Range   WBC 10.9 (H) 4.0 - 10.5 K/uL   RBC 5.32 (H) 3.87 - 5.11 MIL/uL   Hemoglobin 15.5 (H) 12.0 - 15.0 g/dL   HCT 97.9 89.2 - 11.9 %   MCV 84.6 80.0 - 100.0 fL   MCH 29.1 26.0 - 34.0 pg   MCHC 34.4 30.0 - 36.0 g/dL   RDW 41.7 40.8 - 14.4 %   Platelets 335 150 - 400 K/uL   nRBC 0.0 0.0 - 0.2 %  Pregnancy, urine  Result Value Ref Range   Preg Test, Ur NEGATIVE NEGATIVE      Assessment & Plan:   Problem List Items Addressed This Visit   None    Follow up plan: No follow-ups on file.

## 2022-03-25 ENCOUNTER — Ambulatory Visit: Payer: 59 | Admitting: Nurse Practitioner

## 2022-04-16 DIAGNOSIS — Z1389 Encounter for screening for other disorder: Secondary | ICD-10-CM | POA: Diagnosis not present

## 2022-04-16 DIAGNOSIS — R5383 Other fatigue: Secondary | ICD-10-CM | POA: Diagnosis not present

## 2022-04-16 DIAGNOSIS — M545 Low back pain, unspecified: Secondary | ICD-10-CM | POA: Diagnosis not present

## 2022-04-27 ENCOUNTER — Encounter: Payer: Commercial Managed Care - HMO | Admitting: Nurse Practitioner

## 2022-04-27 ENCOUNTER — Telehealth: Payer: Commercial Managed Care - HMO | Admitting: Nurse Practitioner

## 2022-04-27 ENCOUNTER — Encounter: Payer: Self-pay | Admitting: Obstetrics and Gynecology

## 2022-04-27 DIAGNOSIS — B9689 Other specified bacterial agents as the cause of diseases classified elsewhere: Secondary | ICD-10-CM | POA: Diagnosis not present

## 2022-04-27 DIAGNOSIS — N760 Acute vaginitis: Secondary | ICD-10-CM

## 2022-04-27 DIAGNOSIS — N898 Other specified noninflammatory disorders of vagina: Secondary | ICD-10-CM

## 2022-04-27 MED ORDER — METRONIDAZOLE 500 MG PO TABS: 500.0000 mg | ORAL_TABLET | Freq: Two times a day (BID) | ORAL | 0 refills | Status: DC

## 2022-04-27 NOTE — Progress Notes (Signed)
Duplicate evisit 

## 2022-04-27 NOTE — Progress Notes (Signed)

## 2022-04-27 NOTE — Progress Notes (Signed)
I have spent 5 minutes in review of e-visit questionnaire, review and updating patient chart, medical decision making and response to patient.  ° °Londa Mackowski W Sada Mazzoni, NP ° °  °

## 2022-04-29 ENCOUNTER — Other Ambulatory Visit: Payer: Self-pay

## 2022-04-29 DIAGNOSIS — Z3041 Encounter for surveillance of contraceptive pills: Secondary | ICD-10-CM

## 2022-04-29 MED ORDER — NORETHINDRONE ACET-ETHINYL EST 1-20 MG-MCG PO TABS: 1.0000 | ORAL_TABLET | Freq: Every day | ORAL | 0 refills | Status: DC

## 2022-05-23 ENCOUNTER — Encounter: Payer: Self-pay | Admitting: Obstetrics and Gynecology

## 2022-05-23 ENCOUNTER — Ambulatory Visit (INDEPENDENT_AMBULATORY_CARE_PROVIDER_SITE_OTHER): Payer: BLUE CROSS/BLUE SHIELD | Admitting: Obstetrics and Gynecology

## 2022-05-23 VITALS — BP 106/74 | HR 102 | Ht 62.0 in | Wt 165.6 lb

## 2022-05-23 DIAGNOSIS — Z01419 Encounter for gynecological examination (general) (routine) without abnormal findings: Secondary | ICD-10-CM | POA: Diagnosis not present

## 2022-05-23 DIAGNOSIS — Z23 Encounter for immunization: Secondary | ICD-10-CM | POA: Diagnosis not present

## 2022-05-23 DIAGNOSIS — Z3041 Encounter for surveillance of contraceptive pills: Secondary | ICD-10-CM | POA: Diagnosis not present

## 2022-05-23 DIAGNOSIS — Z1231 Encounter for screening mammogram for malignant neoplasm of breast: Secondary | ICD-10-CM

## 2022-05-23 MED ORDER — NORETHINDRONE ACET-ETHINYL EST 1-20 MG-MCG PO TABS: 1.0000 | ORAL_TABLET | Freq: Every day | ORAL | 3 refills | Status: DC

## 2022-05-23 NOTE — Progress Notes (Signed)
HPI:      Ms. Brandee Markin is a 46 y.o. No obstetric history on file. who LMP was No LMP recorded. (Menstrual status: Oral contraceptives).  Subjective:   She presents today for her annual examination.  She has no complaints.  She is taking OCPs continuously and not having menses.  She would like to continue this.  Last year she had a mammogram and underwent a stereotactic biopsy which was benign.  They are going to follow the same spot again this year. She is not fasting today but plans to return for fasting blood work.    Hx: The following portions of the patient's history were reviewed and updated as appropriate:             She  has a past medical history of ADD (attention deficit disorder), Allergy, Anxiety, Asthma, and Depression. She does not have any pertinent problems on file. She  has a past surgical history that includes Cholecystectomy and Cholecystectomy. Her family history is not on file. She was adopted. She  reports that she has never smoked. She has never used smokeless tobacco. She reports current alcohol use. She reports that she does not use drugs. She has a current medication list which includes the following prescription(s): albuterol, clonazepam, fluconazole, fluticasone, gabapentin, ipratropium, metronidazole, multiple vitamin, naproxen, fish oil, omeprazole, polyethylene glycol powder, topiramate, trazodone, ascorbic acid, allegra-d allergy & congestion, escitalopram, norethindrone-ethinyl estradiol, and [DISCONTINUED] bupropion. She is allergic to morphine.       Review of Systems:  Review of Systems  Constitutional: Denied constitutional symptoms, night sweats, recent illness, fatigue, fever, insomnia and weight loss.  Eyes: Denied eye symptoms, eye pain, photophobia, vision change and visual disturbance.  Ears/Nose/Throat/Neck: Denied ear, nose, throat or neck symptoms, hearing loss, nasal discharge, sinus congestion and sore throat.  Cardiovascular:  Denied cardiovascular symptoms, arrhythmia, chest pain/pressure, edema, exercise intolerance, orthopnea and palpitations.  Respiratory: Denied pulmonary symptoms, asthma, pleuritic pain, productive sputum, cough, dyspnea and wheezing.  Gastrointestinal: Denied, gastro-esophageal reflux, melena, nausea and vomiting.  Genitourinary: Denied genitourinary symptoms including symptomatic vaginal discharge, pelvic relaxation issues, and urinary complaints.  Musculoskeletal: Denied musculoskeletal symptoms, stiffness, swelling, muscle weakness and myalgia.  Dermatologic: Denied dermatology symptoms, rash and scar.  Neurologic: Denied neurology symptoms, dizziness, headache, neck pain and syncope.  Psychiatric: Denied psychiatric symptoms, anxiety and depression.  Endocrine: Denied endocrine symptoms including hot flashes and night sweats.   Meds:   Current Outpatient Medications on File Prior to Visit  Medication Sig Dispense Refill   albuterol (VENTOLIN HFA) 108 (90 Base) MCG/ACT inhaler Inhale into the lungs.     clonazePAM (KLONOPIN) 1 MG tablet SMARTSIG:1 Tablet(s) By Mouth Every 12 Hours PRN     fluconazole (DIFLUCAN) 150 MG tablet Take 1 tablet (150 mg total) by mouth every 3 (three) days. For three doses 3 tablet 3   fluticasone (FLONASE) 50 MCG/ACT nasal spray TWO PUFFS IN EACH NOSTRIL ONCE A DAY 16 g 1   gabapentin (NEURONTIN) 300 MG capsule Take by mouth.     ipratropium (ATROVENT) 0.03 % nasal spray Place 2 sprays into both nostrils every 12 (twelve) hours. 30 mL 0   metroNIDAZOLE (FLAGYL) 500 MG tablet Take 1 tablet (500 mg total) by mouth 2 (two) times daily. 14 tablet 0   Multiple Vitamins-Minerals (MULTIVITAMIN PO) Take 1 tablet by mouth daily.     naproxen (NAPROSYN) 500 MG tablet Take by mouth.     Omega-3 Fatty Acids (FISH OIL) 1000 MG CAPS Take  1 capsule by mouth 2 (two) times daily.     omeprazole (PRILOSEC) 20 MG capsule Take 20 mg by mouth daily.     polyethylene glycol powder  (GLYCOLAX/MIRALAX) 17 GM/SCOOP powder Take by mouth.     topiramate (TOPAMAX) 50 MG tablet Take 50 mg by mouth 2 (two) times daily.     traZODone (DESYREL) 100 MG tablet      vitamin C (ASCORBIC ACID) 500 MG tablet Take 500 mg by mouth 2 (two) times daily.     ALLEGRA-D ALLERGY & CONGESTION 180-240 MG 24 hr tablet Take 1 tablet by mouth daily. (Patient not taking: Reported on 05/23/2022)     escitalopram (LEXAPRO) 10 MG tablet 5 mg daily for one week, then 10 mg daily (Patient not taking: Reported on 05/23/2022) 30 tablet 1   [DISCONTINUED] buPROPion (WELLBUTRIN XL) 150 MG 24 hr tablet Take 1 tablet (150 mg total) by mouth daily. 30 tablet 2   No current facility-administered medications on file prior to visit.     Objective:     Vitals:   05/23/22 0823  BP: 106/74  Pulse: (!) 102    Filed Weights   05/23/22 0823  Weight: 165 lb 9.6 oz (75.1 kg)              Physical examination General NAD, Conversant  HEENT Atraumatic; Op clear with mmm.  Normo-cephalic. Pupils reactive. Anicteric sclerae  Thyroid/Neck Smooth without nodularity or enlargement. Normal ROM.  Neck Supple.  Skin No rashes, lesions or ulceration. Normal palpated skin turgor. No nodularity.  Breasts: No masses or discharge.  Symmetric.  No axillary adenopathy.  Lungs: Clear to auscultation.No rales or wheezes. Normal Respiratory effort, no retractions.  Heart: NSR.  No murmurs or rubs appreciated. No periferal edema  Abdomen: Soft.  Non-tender.  No masses.  No HSM. No hernia  Extremities: Moves all appropriately.  Normal ROM for age. No lymphadenopathy.  Neuro: Oriented to PPT.  Normal mood. Normal affect.     Pelvic:   Vulva: Normal appearance.  No lesions.  Vagina: No lesions or abnormalities noted.  Support: Normal pelvic support.  Urethra No masses tenderness or scarring.  Meatus Normal size without lesions or prolapse.  Cervix: Normal appearance.  No lesions.  Anus: Normal exam.  No lesions.  Perineum:  Normal exam.  No lesions.        Bimanual   Uterus: Normal size.  Non-tender.  Mobile.  AV.  Adnexae: No masses.  Non-tender to palpation.  Cul-de-sac: Negative for abnormality.     Assessment:    No obstetric history on file. Patient Active Problem List   Diagnosis Date Noted   Chest pain 10/20/2013   Allergic rhinitis 09/28/2013   Positive urine drug screen 08/16/2013   ADD (attention deficit disorder) 02/23/2013   Migraine with aura 01/13/2013   Anxiety and depression    Allergy      1. Well woman exam with routine gynecological exam   2. Screening mammogram for breast cancer   3. Surveillance for birth control, oral contraceptives   4. Need for influenza vaccination     Normal exam   Plan:            1.  Basic Screening Recommendations The basic screening recommendations for asymptomatic women were discussed with the patient during her visit.  The age-appropriate recommendations were discussed with her and the rational for the tests reviewed.  When I am informed by the patient that another primary care physician has  previously obtained the age-appropriate tests and they are up-to-date, only outstanding tests are ordered and referrals given as necessary.  Abnormal results of tests will be discussed with her when all of her results are completed.  Routine preventative health maintenance measures emphasized: Exercise/Diet/Weight control, Tobacco Warnings, Alcohol/Substance use risks and Stress Management Mammogram ordered -patient will return for fasting blood work Orders Orders Placed This Encounter  Procedures   MM DIGITAL SCREENING BILATERAL   Flu vaccine greater than or equal to 3yo preservative free IM   Basic metabolic panel   CBC   TSH   Lipid panel   Hemoglobin A1c     Meds ordered this encounter  Medications   norethindrone-ethinyl estradiol (LOESTRIN) 1-20 MG-MCG tablet    Sig: Take 1 tablet by mouth daily.    Dispense:  84 tablet    Refill:  3          F/U  Return in about 1 year (around 05/24/2023) for Annual Physical.  Elonda Husky, M.D. 05/23/2022 8:50 AM

## 2022-05-23 NOTE — Progress Notes (Signed)
Patients presents for annual exam today. She states doing well with continuous OCP use. She  is up to date on pap smear. Patient is due for mammogram, ordered. Annual labs are ordered, will return when fasting. Patient states no other questions or concerns at this time.  Flu shot administered.

## 2022-05-23 NOTE — Addendum Note (Signed)
Addended by: Loman Chroman on: 05/23/2022 09:13 AM   Modules accepted: Orders

## 2022-05-24 DIAGNOSIS — Z79899 Other long term (current) drug therapy: Secondary | ICD-10-CM | POA: Diagnosis not present

## 2022-05-24 DIAGNOSIS — F902 Attention-deficit hyperactivity disorder, combined type: Secondary | ICD-10-CM | POA: Diagnosis not present

## 2022-05-24 DIAGNOSIS — F419 Anxiety disorder, unspecified: Secondary | ICD-10-CM | POA: Diagnosis not present

## 2022-06-03 ENCOUNTER — Telehealth: Payer: Self-pay | Admitting: Obstetrics and Gynecology

## 2022-06-03 ENCOUNTER — Other Ambulatory Visit: Payer: 59

## 2022-06-03 NOTE — Telephone Encounter (Signed)
I contacted patient via phone about missed appoitnement today 06/03/22. I left message on voicemail for patient to call back to r/s.

## 2022-06-04 ENCOUNTER — Telehealth: Payer: Self-pay | Admitting: Obstetrics and Gynecology

## 2022-06-04 NOTE — Telephone Encounter (Signed)
Reached out to pt to reschedule fasting labs that were scheduled on 12/18 at 8:40.  Left message for pt to call back to reschedule.

## 2022-06-05 ENCOUNTER — Encounter: Payer: Self-pay | Admitting: Obstetrics and Gynecology

## 2022-06-05 NOTE — Telephone Encounter (Signed)
Reached out to pt (2x) to reschedule fasting labs that were scheduled on 06/03/22 at 8:40.  Left message for pt to call back to reschedule.  Will send a MyChart letter.

## 2022-06-15 ENCOUNTER — Emergency Department: Payer: BLUE CROSS/BLUE SHIELD

## 2022-06-15 ENCOUNTER — Other Ambulatory Visit: Payer: Self-pay

## 2022-06-15 ENCOUNTER — Emergency Department
Admission: EM | Admit: 2022-06-15 | Discharge: 2022-06-15 | Disposition: A | Discharge status: Home / Self Care | Payer: BLUE CROSS/BLUE SHIELD | Attending: Emergency Medicine | Admitting: Emergency Medicine

## 2022-06-15 DIAGNOSIS — R1030 Lower abdominal pain, unspecified: Secondary | ICD-10-CM

## 2022-06-15 DIAGNOSIS — R109 Unspecified abdominal pain: Secondary | ICD-10-CM | POA: Diagnosis not present

## 2022-06-15 DIAGNOSIS — N3090 Cystitis, unspecified without hematuria: Secondary | ICD-10-CM

## 2022-06-15 DIAGNOSIS — R1031 Right lower quadrant pain: Secondary | ICD-10-CM | POA: Diagnosis not present

## 2022-06-15 LAB — CBC WITH DIFFERENTIAL/PLATELET
Abs Immature Granulocytes: 0.01 10*3/uL (ref 0.00–0.07)
Basophils Absolute: 0 10*3/uL (ref 0.0–0.1)
Basophils Relative: 1 %
Eosinophils Absolute: 0.3 10*3/uL (ref 0.0–0.5)
Eosinophils Relative: 6 %
HCT: 41.9 % (ref 36.0–46.0)
Hemoglobin: 14.1 g/dL (ref 12.0–15.0)
Immature Granulocytes: 0 %
Lymphocytes Relative: 34 %
Lymphs Abs: 2 10*3/uL (ref 0.7–4.0)
MCH: 28.7 pg (ref 26.0–34.0)
MCHC: 33.7 g/dL (ref 30.0–36.0)
MCV: 85.3 fL (ref 80.0–100.0)
Monocytes Absolute: 0.3 10*3/uL (ref 0.1–1.0)
Monocytes Relative: 6 %
Neutro Abs: 3.2 10*3/uL (ref 1.7–7.7)
Neutrophils Relative %: 53 %
Platelets: 270 10*3/uL (ref 150–400)
RBC: 4.91 MIL/uL (ref 3.87–5.11)
RDW: 13.4 % (ref 11.5–15.5)
WBC: 6 10*3/uL (ref 4.0–10.5)
nRBC: 0 % (ref 0.0–0.2)

## 2022-06-15 LAB — POC URINE PREG, ED: Preg Test, Ur: NEGATIVE

## 2022-06-15 LAB — URINALYSIS, ROUTINE W REFLEX MICROSCOPIC
Bilirubin Urine: NEGATIVE
Glucose, UA: NEGATIVE mg/dL
Hgb urine dipstick: NEGATIVE
Ketones, ur: NEGATIVE mg/dL
Nitrite: NEGATIVE
Protein, ur: NEGATIVE mg/dL
Specific Gravity, Urine: 1.02 (ref 1.005–1.030)
pH: 5 (ref 5.0–8.0)

## 2022-06-15 LAB — COMPREHENSIVE METABOLIC PANEL
ALT: 19 U/L (ref 0–44)
AST: 26 U/L (ref 15–41)
Albumin: 3.3 g/dL — ABNORMAL LOW (ref 3.5–5.0)
Alkaline Phosphatase: 48 U/L (ref 38–126)
Anion gap: 9 (ref 5–15)
BUN: 14 mg/dL (ref 6–20)
CO2: 19 mmol/L — ABNORMAL LOW (ref 22–32)
Calcium: 8.6 mg/dL — ABNORMAL LOW (ref 8.9–10.3)
Chloride: 111 mmol/L (ref 98–111)
Creatinine, Ser: 0.73 mg/dL (ref 0.44–1.00)
GFR, Estimated: >60 mL/min (ref >60)
Glucose, Bld: 104 mg/dL — ABNORMAL HIGH (ref 70–99)
Potassium: 3.7 mmol/L (ref 3.5–5.1)
Sodium: 139 mmol/L (ref 135–145)
Total Bilirubin: 0.4 mg/dL (ref 0.3–1.2)
Total Protein: 6.4 g/dL — ABNORMAL LOW (ref 6.5–8.1)

## 2022-06-15 LAB — LIPASE, BLOOD: Lipase: 51 U/L (ref 11–51)

## 2022-06-15 MED ORDER — ONDANSETRON HCL 4 MG/2ML IJ SOLN
4.0000 mg | Freq: Once | INTRAMUSCULAR | Status: AC
  Administered 2022-06-15: 4 mg via INTRAVENOUS
  Filled 2022-06-15: qty 2

## 2022-06-15 MED ORDER — IOHEXOL 300 MG/ML  SOLN
100.0000 mL | Freq: Once | INTRAMUSCULAR | Status: AC | PRN
  Administered 2022-06-15: 100 mL via INTRAVENOUS

## 2022-06-15 MED ORDER — FENTANYL CITRATE PF 50 MCG/ML IJ SOSY
50.0000 ug | PREFILLED_SYRINGE | Freq: Once | INTRAMUSCULAR | Status: AC
  Administered 2022-06-15: 50 ug via INTRAVENOUS
  Filled 2022-06-15: qty 1

## 2022-06-15 MED ORDER — CEPHALEXIN 500 MG PO CAPS: 500.0000 mg | ORAL_CAPSULE | Freq: Two times a day (BID) | ORAL | 0 refills | Status: AC

## 2022-06-15 MED ORDER — NAPROXEN 500 MG PO TABS: 500.0000 mg | ORAL_TABLET | Freq: Two times a day (BID) | ORAL | 0 refills | Status: DC

## 2022-06-15 MED ORDER — ONDANSETRON 4 MG PO TBDP: 4.0000 mg | ORAL_TABLET | Freq: Three times a day (TID) | ORAL | 0 refills | Status: DC | PRN

## 2022-06-15 NOTE — ED Provider Triage Note (Signed)
Emergency Medicine Provider Triage Evaluation Note  Joanna Walker , a 45 y.o. female  was evaluated in triage.  Pt complains of right side pain. Nausea and vomiting. No fever. No history of ovarian cysts. Cholecystectomy only abdominal surgery history.  Physical Exam  There were no vitals taken for this visit. Gen:   Awake, no distress   Resp:  Normal effort  MSK:   Moves extremities without difficulty  Other:    Medical Decision Making  Medically screening exam initiated at 11:51 AM.  Appropriate orders placed.  Burr Medico was informed that the remainder of the evaluation will be completed by another provider, this initial triage assessment does not replace that evaluation, and the importance of remaining in the ED until their evaluation is complete.    Chinita Pester, FNP 06/15/22 1156

## 2022-06-15 NOTE — ED Triage Notes (Signed)
Pt has had RLQ pain since yesterday- pt has nausea, but denies vomiting and diarrhea- pt denies urinary symptoms

## 2022-06-15 NOTE — ED Provider Notes (Signed)
[facility] Provider Note    Event Date/Time   First MD Initiated Contact with Patient 06/15/22 1407     (approximate)   History   Chief Complaint: Abdominal Pain   HPI  Joanna Walker is a 46 y.o. female with a history of anxiety, depression, migraines who comes ED complaining of nausea vomiting and diarrhea for the past 4 days, developing lower abdominal pain over the last few days that seems to be localizing to the right lower quadrant.  No fever.  No chest pain shortness of breath dizziness or syncope.  Does endorse some urinary frequency and urgency.  No flank pain.     Physical Exam   Triage Vital Signs: ED Triage Vitals [06/15/22 1156]  Enc Vitals Group     BP 112/82     Pulse Rate 82     Resp 18     Temp 97.7 F (36.5 C)     Temp Source Oral     SpO2 96 %     Weight 158 lb (71.7 kg)     Height 5\' 3"  (1.6 m)     Head Circumference      Peak Flow      Pain Score 8     Pain Loc      Pain Edu?      Excl. in GC?     Most recent vital signs: Vitals:   06/15/22 1156  BP: 112/82  Pulse: 82  Resp: 18  Temp: 97.7 F (36.5 C)  SpO2: 96%    General: Awake, no distress.  CV:  Good peripheral perfusion.  Resp:  Normal effort.  Abd:  No distention.  Soft with suprapubic and right lower quadrant tenderness, not focal to McBurney's point.  No CVA tenderness Other:     ED Results / Procedures / Treatments   Labs (all labs ordered are listed, but only abnormal results are displayed) Labs Reviewed  COMPREHENSIVE METABOLIC PANEL - Abnormal; Notable for the following components:      Result Value   CO2 19 (*)    Glucose, Bld 104 (*)    Calcium 8.6 (*)    Total Protein 6.4 (*)    Albumin 3.3 (*)    All other components within normal limits  URINALYSIS, ROUTINE W REFLEX MICROSCOPIC - Abnormal; Notable for the following components:   Color, Urine YELLOW (*)    APPearance CLOUDY (*)    Leukocytes,Ua MODERATE (*)     Bacteria, UA MANY (*)    All other components within normal limits  LIPASE, BLOOD  CBC WITH DIFFERENTIAL/PLATELET  POC URINE PREG, ED     EKG    RADIOLOGY CT abdomen pelvis interpreted by me, negative for ureterolithiasis or appendicitis.  Radiology report reviewed   PROCEDURES:  Procedures   MEDICATIONS ORDERED IN ED: Medications  ondansetron (ZOFRAN) injection 4 mg (4 mg Intravenous Given 06/15/22 1204)  fentaNYL (SUBLIMAZE) injection 50 mcg (50 mcg Intravenous Given 06/15/22 1206)  iohexol (OMNIPAQUE) 300 MG/ML solution 100 mL (100 mLs Intravenous Contrast Given 06/15/22 1345)     IMPRESSION / MDM / ASSESSMENT AND PLAN / ED COURSE  I reviewed the triage vital signs and the nursing notes.                              Differential diagnosis includes, but is not limited to, appendicitis, kidney stone, cystitis, viral illness, lymphadenopathy  Patient's presentation is  most consistent with acute presentation with potential threat to life or bodily function.  Patient presents with multitude of symptoms consistent with a viral GI syndrome.  With pain mostly localizing to the right lower quadrant with tenderness, CT scan obtained which is negative for appendicitis or other acute findings.  Vital signs are normal, patient is nontoxic, lab workup is reassuring.  She does not require admission.  Will treat supportively with Zofran and naproxen along with Keflex for appearance of UTI on the urinalysis.  Stable for discharge home       FINAL CLINICAL IMPRESSION(S) / ED DIAGNOSES   Final diagnoses:  Lower abdominal pain  Cystitis     Rx / DC Orders   ED Discharge Orders          Ordered    cephALEXin (KEFLEX) 500 MG capsule  2 times daily        06/15/22 1448    ondansetron (ZOFRAN-ODT) 4 MG disintegrating tablet  Every 8 hours PRN        06/15/22 1448    naproxen (NAPROSYN) 500 MG tablet  2 times daily with meals        06/15/22 1448             Note:   This document was prepared using Dragon voice recognition software and may include unintentional dictation errors.   Sharman Cheek, MD 06/15/22 1455

## 2022-06-20 ENCOUNTER — Other Ambulatory Visit: Payer: Self-pay | Admitting: Obstetrics and Gynecology

## 2022-06-20 DIAGNOSIS — Z1231 Encounter for screening mammogram for malignant neoplasm of breast: Secondary | ICD-10-CM

## 2022-06-21 ENCOUNTER — Telehealth: Payer: Commercial Managed Care - HMO | Admitting: Emergency Medicine

## 2022-06-21 DIAGNOSIS — B3731 Acute candidiasis of vulva and vagina: Secondary | ICD-10-CM | POA: Diagnosis not present

## 2022-06-21 MED ORDER — FLUCONAZOLE 150 MG PO TABS: 150.0000 mg | ORAL_TABLET | Freq: Every day | ORAL | 0 refills | Status: DC

## 2022-06-21 NOTE — Progress Notes (Signed)

## 2022-06-27 ENCOUNTER — Encounter: Payer: 59 | Admitting: Nurse Practitioner

## 2022-06-27 DIAGNOSIS — B351 Tinea unguium: Secondary | ICD-10-CM

## 2022-06-27 NOTE — Progress Notes (Signed)
I have spent 5 minutes in review of e-visit questionnaire, review and updating patient chart, medical decision making and response to patient.  ° °Brenda Cowher W Ame Heagle, NP ° °  °

## 2022-06-27 NOTE — Progress Notes (Signed)
Hello,  Unfortunately we do not treat for toenail or fingernail fungus. This would likely require long term therapy which would need to be prescribed by a PCP.   NOTE: You will NOT be charged for this eVisit.  If you do not have a PCP, [facility] offers a free physician referral service available at [phone]. Our trained staff has the experience, knowledge and resources to put you in touch with a physician who is right for you.    If you are having a true medical emergency please call 911.   Your e-visit answers were reviewed by a board certified advanced clinical practitioner to complete your personal care plan.  Thank you for using e-Visits.

## 2022-06-28 ENCOUNTER — Encounter: Payer: Self-pay | Admitting: Physician Assistant

## 2022-06-28 ENCOUNTER — Telehealth: Payer: BLUE CROSS/BLUE SHIELD | Admitting: Physician Assistant

## 2022-06-28 DIAGNOSIS — J0110 Acute frontal sinusitis, unspecified: Secondary | ICD-10-CM | POA: Diagnosis not present

## 2022-06-28 MED ORDER — FLUTICASONE PROPIONATE 50 MCG/ACT NA SUSP: NASAL | 1 refills | Status: AC

## 2022-06-28 NOTE — Progress Notes (Signed)
E-Visit for Sinus Problems  We are sorry that you are not feeling well.  Here is how we plan to help!  Based on what you have shared with me it looks like you have sinusitis.  Sinusitis is inflammation and infection in the sinus cavities of the head.  Based on your presentation I believe you most likely have Acute Viral Sinusitis.This is an infection most likely caused by a virus. There is not specific treatment for viral sinusitis other than to help you with the symptoms until the infection runs its course.  You may use an oral decongestant such as Mucinex D or if you have glaucoma or high blood pressure use plain Mucinex. Saline nasal spray help and can safely be used as often as needed for congestion, I have prescribed: Fluticasone nasal spray two sprays in each nostril once a day  Some authorities believe that zinc sprays or the use of Echinacea may shorten the course of your symptoms.  Sinus infections are not as easily transmitted as other respiratory infection, however we still recommend that you avoid close contact with loved ones, especially the very young and elderly.  Remember to wash your hands thoroughly throughout the day as this is the number one way to prevent the spread of infection!  Home Care: Only take medications as instructed by your medical team. Do not take these medications with alcohol. A steam or ultrasonic humidifier can help congestion.  You can place a towel over your head and breathe in the steam from hot water coming from a faucet. Avoid close contacts especially the very young and the elderly. Cover your mouth when you cough or sneeze. Always remember to wash your hands.  Get Help Right Away If: You develop worsening fever or sinus pain. You develop a severe head ache or visual changes. Your symptoms persist after you have completed your treatment plan.  Make sure you Understand these instructions. Will watch your condition. Will get help right away if you  are not doing well or get worse.   Thank you for choosing an e-visit.  Your e-visit answers were reviewed by a board certified advanced clinical practitioner to complete your personal care plan. Depending upon the condition, your plan could have included both over the counter or prescription medications.  Please review your pharmacy choice. Make sure the pharmacy is open so you can pick up prescription now. If there is a problem, you may contact your provider through MyChart messaging and have the prescription routed to another pharmacy.  Your safety is important to us. If you have drug allergies check your prescription carefully.   For the next 24 hours you can use MyChart to ask questions about today's visit, request a non-urgent call back, or ask for a work or school excuse. You will get an email in the next two days asking about your experience. I hope that your e-visit has been valuable and will speed your recovery.  I have spent 5 minutes in review of e-visit questionnaire, review and updating patient chart, medical decision making and response to patient.   Elliot Simoneaux S Mayers, PA-C      

## 2022-07-18 ENCOUNTER — Telehealth: Payer: BLUE CROSS/BLUE SHIELD | Admitting: Physician Assistant

## 2022-07-18 DIAGNOSIS — J329 Chronic sinusitis, unspecified: Secondary | ICD-10-CM | POA: Diagnosis not present

## 2022-07-18 MED ORDER — FLUCONAZOLE 150 MG PO TABS: ORAL_TABLET | ORAL | 0 refills | Status: DC

## 2022-07-18 MED ORDER — AMOXICILLIN-POT CLAVULANATE 875-125 MG PO TABS: 1.0000 | ORAL_TABLET | Freq: Two times a day (BID) | ORAL | 0 refills | Status: DC

## 2022-07-18 NOTE — Progress Notes (Signed)

## 2022-07-18 NOTE — Addendum Note (Signed)
Addended by: Brunetta Jeans on: 07/18/2022 10:59 AM   Modules accepted: Orders

## 2022-07-29 NOTE — Progress Notes (Signed)
I have spent 5 minutes in review of e-visit questionnaire, review and updating patient chart, medical decision making and response to patient.   Darian Cansler Cody Anniebelle Devore, PA-C    

## 2022-07-30 ENCOUNTER — Ambulatory Visit
Admission: RE | Admit: 2022-07-30 | Discharge: 2022-07-30 | Disposition: A | Discharge status: Home / Self Care | Payer: BLUE CROSS/BLUE SHIELD | Source: Ambulatory Visit

## 2022-07-30 ENCOUNTER — Telehealth: Payer: BLUE CROSS/BLUE SHIELD | Admitting: Physician Assistant

## 2022-07-30 DIAGNOSIS — N898 Other specified noninflammatory disorders of vagina: Secondary | ICD-10-CM

## 2022-07-30 DIAGNOSIS — Z1231 Encounter for screening mammogram for malignant neoplasm of breast: Secondary | ICD-10-CM

## 2022-07-31 ENCOUNTER — Telehealth: Payer: BLUE CROSS/BLUE SHIELD | Admitting: Family Medicine

## 2022-07-31 DIAGNOSIS — J329 Chronic sinusitis, unspecified: Secondary | ICD-10-CM

## 2022-07-31 NOTE — Progress Notes (Signed)
Because of ongoing symptoms despite coverage for vaginal yeast with doses of Diflucan, I feel your condition warrants further evaluation and I recommend that you be seen in a face to face visit.   NOTE: There will be NO CHARGE for this eVisit   If you are having a true medical emergency please call 911.      For an urgent face to face visit, [facility] has eight urgent care centers for your convenience:   NEW!! [facility] [place] [facility] [place] Get Driving Directions [id number] [address] [place], [place] [facility] [place] [facility] [place] Get Driving Directions [id number] [place], [place] [address]   [place] [facility]) Get Driving Directions [id number] [address], [place] [address]  [place] [facility] [place] ([place]) Get Driving Directions [id number] [address] [place],  [place]  [address]  [place] [facility] - at [address] [place] Get Driving Directions  [id number] [place],  [place] [address]   [facility] [facility] Get Driving Directions [id number] [place], [place], [place] [address]   [place] Get Driving Directions  [id number] [address] [place], [place] [address]    [facility] at [place] Get Driving Directions [id number] [address], [place], [place] [address]  Your MyChart E-visit questionnaire answers were reviewed by a board certified advanced clinical practitioner to complete your personal care plan based on your specific symptoms.  Thank you for using e-Visits.

## 2022-08-01 NOTE — Progress Notes (Signed)
Because you have been treated with first line therapy and failed to fully improve- it is our policy to have you seen in person. Your condition warrants further evaluation and I recommend that you be seen in a face to face visit.   NOTE: There will be NO CHARGE for this eVisit

## 2022-10-21 ENCOUNTER — Encounter (HOSPITAL_COMMUNITY): Payer: Self-pay

## 2022-12-26 ENCOUNTER — Encounter: Payer: Self-pay | Admitting: Obstetrics and Gynecology

## 2022-12-26 ENCOUNTER — Telehealth: Payer: BLUE CROSS/BLUE SHIELD | Admitting: Physician Assistant

## 2022-12-26 DIAGNOSIS — H60391 Other infective otitis externa, right ear: Secondary | ICD-10-CM

## 2022-12-26 MED ORDER — AMOXICILLIN-POT CLAVULANATE 875-125 MG PO TABS: 1.0000 | ORAL_TABLET | Freq: Two times a day (BID) | ORAL | 0 refills | Status: AC

## 2022-12-26 MED ORDER — NEOMYCIN-POLYMYXIN-HC 3.5-10000-1 OT SOLN: 3.0000 [drp] | Freq: Four times a day (QID) | OTIC | 0 refills | Status: DC

## 2022-12-26 NOTE — Progress Notes (Signed)
E Visit for Ear Pain - Swimmer's Ear  We are sorry that you are not feeling well. Here is how we plan to help!  Based on what you have shared with me it looks like you have Swimmer's Ear.  Swimmer's ear is a redness or swelling, irritation, or infection of your outer ear canal. These symptoms usually occur within a few days of swimming. Your ear canal is a tube that goes from the opening of the ear to the eardrum.  When water stays in your ear canal, germs can grow.  This is a painful condition that often happens to children and swimmers of all ages.  It is not contagious and oral antibiotics are not required to treat uncomplicated swimmer's ear.  The usual symptoms include:    Itchiness inside the ear  Redness or a sense of swelling in the ear  Pain when the ear is tugged on when pressure is placed on the ear  Pus draining from the infected ear   I have prescribed Augmentin 875-125 mg one tablet by mouth twice a day for 10 days  I have prescribed: Neomycin 0.35%, polymyxin B 10,000 units/mL, and hydrocortisone 0,5% otic solution 4 drops in affected ears four times a day for 7 days  In certain cases, swimmer's ear may progress to a more serious bacterial infection of the middle or inner ear.  If you have a fever 102 and up and significantly worsening symptoms, this could indicate a more serious infection moving to the middle/inner and needs face to face evaluation in an office by a provider.  Your symptoms should improve over the next 3 days and should resolve in about 7 days.  Be sure to complete ALL of your prescription.  HOME CARE: Wash your hands frequently. If you are prescribed an ear drop, do not place the tip of the bottle on your ear or touch it with your fingers. You can take Acetaminophen 650 mg every 4-6 hours as needed for pain.  If pain is severe or moderate, you can apply a heating pad (set on low) or hot water bottle (wrapped in a towel) to outer ear for 20 minutes.  This will  also increase drainage. Avoid ear plugs Do not go swimming until the symptoms are gone Do not use Q-tips After showers, help the water run out by tilting your head to one side.   GET HELP RIGHT AWAY IF: Fever is over 102.2 degrees. You develop progressive ear pain or hearing loss. Ear symptoms persist longer than 3 days after treatment.  MAKE SURE YOU: Understand these instructions. Will watch your condition. Will get help right away if you are not doing well or get worse.  TO PREVENT SWIMMER'S EAR: Use a bathing cap or custom fitted swim molds to keep your ears dry. Towel off after swimming to dry your ears. Tilt your head or pull your earlobes to allow the water to escape your ear canal. If there is still water in your ears, consider using a hairdryer on the lowest setting.  Thank you for choosing an e-visit.  Your e-visit answers were reviewed by a board certified advanced clinical practitioner to complete your personal care plan. Depending upon the condition, your plan could have included both over the counter or prescription medications.  Please review your pharmacy choice. Make sure the pharmacy is open so you can pick up the prescription now. If there is a problem, you may contact your provider through [employer] and have the  prescription routed to another pharmacy.  Your safety is important to [place]. If you have drug allergies check your prescription carefully.   For the next 24 hours you can use MyChart to ask questions about today's visit, request a non-urgent call back, or ask for a work or school excuse. You will get an email with a survey after your eVisit asking about your experience. We would appreciate your feedback. I hope that your e-visit has been valuable and will aid in your recovery.  I have spent 5 minutes in review of e-visit questionnaire, review and updating patient chart, medical decision making and response to patient.   Margaretann Loveless, PA-C

## 2023-01-31 ENCOUNTER — Telehealth: Payer: Self-pay

## 2023-02-03 ENCOUNTER — Other Ambulatory Visit: Payer: Self-pay

## 2023-02-03 DIAGNOSIS — Z3041 Encounter for surveillance of contraceptive pills: Secondary | ICD-10-CM

## 2023-02-03 MED ORDER — NORETHINDRONE ACET-ETHINYL EST 1-20 MG-MCG PO TABS: 1.0000 | ORAL_TABLET | Freq: Every day | ORAL | 1 refills | Status: DC

## 2023-02-03 NOTE — Telephone Encounter (Signed)
Rx sent to requested pharmacy

## 2023-03-26 ENCOUNTER — Other Ambulatory Visit: Payer: Self-pay | Admitting: Physician Assistant

## 2023-03-26 DIAGNOSIS — M25552 Pain in left hip: Secondary | ICD-10-CM

## 2023-04-10 ENCOUNTER — Ambulatory Visit
Admission: RE | Admit: 2023-04-10 | Discharge: 2023-04-10 | Disposition: A | Discharge status: Home / Self Care | Payer: BLUE CROSS/BLUE SHIELD | Source: Ambulatory Visit | Attending: Physician Assistant | Admitting: Physician Assistant

## 2023-04-10 DIAGNOSIS — M25552 Pain in left hip: Secondary | ICD-10-CM

## 2023-04-11 ENCOUNTER — Other Ambulatory Visit: Payer: Self-pay

## 2023-04-11 ENCOUNTER — Encounter: Payer: Self-pay | Admitting: Internal Medicine

## 2023-04-11 ENCOUNTER — Inpatient Hospital Stay
Admission: EM | Admit: 2023-04-11 | Discharge: 2023-04-13 | DRG: 482 | Disposition: A | Discharge status: Home Health | Payer: BLUE CROSS/BLUE SHIELD | Attending: Internal Medicine | Admitting: Internal Medicine

## 2023-04-11 DIAGNOSIS — F988 Other specified behavioral and emotional disorders with onset usually occurring in childhood and adolescence: Secondary | ICD-10-CM | POA: Diagnosis present

## 2023-04-11 DIAGNOSIS — J45909 Unspecified asthma, uncomplicated: Secondary | ICD-10-CM | POA: Diagnosis present

## 2023-04-11 DIAGNOSIS — Z9049 Acquired absence of other specified parts of digestive tract: Secondary | ICD-10-CM | POA: Diagnosis not present

## 2023-04-11 DIAGNOSIS — S7290XA Unspecified fracture of unspecified femur, initial encounter for closed fracture: Secondary | ICD-10-CM | POA: Diagnosis present

## 2023-04-11 DIAGNOSIS — S72145A Nondisplaced intertrochanteric fracture of left femur, initial encounter for closed fracture: Principal | ICD-10-CM | POA: Diagnosis present

## 2023-04-11 DIAGNOSIS — Z79899 Other long term (current) drug therapy: Secondary | ICD-10-CM | POA: Diagnosis not present

## 2023-04-11 DIAGNOSIS — Z635 Disruption of family by separation and divorce: Secondary | ICD-10-CM | POA: Diagnosis not present

## 2023-04-11 DIAGNOSIS — F32A Depression, unspecified: Secondary | ICD-10-CM | POA: Diagnosis present

## 2023-04-11 DIAGNOSIS — F419 Anxiety disorder, unspecified: Secondary | ICD-10-CM | POA: Diagnosis present

## 2023-04-11 DIAGNOSIS — X509XXA Other and unspecified overexertion or strenuous movements or postures, initial encounter: Secondary | ICD-10-CM | POA: Diagnosis not present

## 2023-04-11 DIAGNOSIS — Z885 Allergy status to narcotic agent status: Secondary | ICD-10-CM | POA: Diagnosis not present

## 2023-04-11 DIAGNOSIS — F909 Attention-deficit hyperactivity disorder, unspecified type: Secondary | ICD-10-CM | POA: Diagnosis not present

## 2023-04-11 LAB — TYPE AND SCREEN
ABO/RH(D): A POS
Antibody Screen: NEGATIVE

## 2023-04-11 LAB — BASIC METABOLIC PANEL
Anion gap: 9 (ref 5–15)
BUN: 10 mg/dL (ref 6–20)
CO2: 22 mmol/L (ref 22–32)
Calcium: 8.9 mg/dL (ref 8.9–10.3)
Chloride: 105 mmol/L (ref 98–111)
Creatinine, Ser: 0.76 mg/dL (ref 0.44–1.00)
GFR, Estimated: >60 mL/min (ref >60)
Glucose, Bld: 104 mg/dL — ABNORMAL HIGH (ref 70–99)
Potassium: 3.5 mmol/L (ref 3.5–5.1)
Sodium: 136 mmol/L (ref 135–145)

## 2023-04-11 LAB — CBC WITH DIFFERENTIAL/PLATELET
Abs Immature Granulocytes: 0.02 10*3/uL (ref 0.00–0.07)
Basophils Absolute: 0.1 10*3/uL (ref 0.0–0.1)
Basophils Relative: 1 %
Eosinophils Absolute: 0.3 10*3/uL (ref 0.0–0.5)
Eosinophils Relative: 3 %
HCT: 39.1 % (ref 36.0–46.0)
Hemoglobin: 13.5 g/dL (ref 12.0–15.0)
Immature Granulocytes: 0 %
Lymphocytes Relative: 28 %
Lymphs Abs: 2.1 10*3/uL (ref 0.7–4.0)
MCH: 29.9 pg (ref 26.0–34.0)
MCHC: 34.5 g/dL (ref 30.0–36.0)
MCV: 86.5 fL (ref 80.0–100.0)
Monocytes Absolute: 0.5 10*3/uL (ref 0.1–1.0)
Monocytes Relative: 7 %
Neutro Abs: 4.4 10*3/uL (ref 1.7–7.7)
Neutrophils Relative %: 61 %
Platelets: 292 10*3/uL (ref 150–400)
RBC: 4.52 MIL/uL (ref 3.87–5.11)
RDW: 13.1 % (ref 11.5–15.5)
WBC: 7.3 10*3/uL (ref 4.0–10.5)
nRBC: 0 % (ref 0.0–0.2)

## 2023-04-11 LAB — PROTIME-INR
INR: 1.1 (ref 0.8–1.2)
Prothrombin Time: 14.2 s (ref 11.4–15.2)

## 2023-04-11 MED ORDER — HYDROMORPHONE HCL 1 MG/ML IJ SOLN
0.5000 mg | INTRAMUSCULAR | Status: DC | PRN
  Administered 2023-04-11 – 2023-04-13 (×7): 0.5 mg via INTRAVENOUS
  Filled 2023-04-11 (×7): qty 0.5

## 2023-04-11 MED ORDER — ONDANSETRON HCL 4 MG/2ML IJ SOLN
4.0000 mg | Freq: Four times a day (QID) | INTRAMUSCULAR | Status: DC | PRN
  Administered 2023-04-11: 4 mg via INTRAVENOUS
  Filled 2023-04-11: qty 2

## 2023-04-11 MED ORDER — TOPIRAMATE 25 MG PO TABS
50.0000 mg | ORAL_TABLET | Freq: Two times a day (BID) | ORAL | Status: DC
  Administered 2023-04-12 – 2023-04-13 (×2): 50 mg via ORAL
  Filled 2023-04-11 (×4): qty 2

## 2023-04-11 MED ORDER — POLYETHYLENE GLYCOL 3350 17 G PO PACK: 17.0000 g | PACK | Freq: Every day | ORAL | Status: DC | PRN

## 2023-04-11 MED ORDER — OXYCODONE HCL 5 MG PO TABS
5.0000 mg | ORAL_TABLET | ORAL | Status: DC | PRN
  Administered 2023-04-11: 5 mg via ORAL
  Administered 2023-04-13: 10 mg via ORAL
  Filled 2023-04-11 (×2): qty 2
  Filled 2023-04-11: qty 1

## 2023-04-11 MED ORDER — ARIPIPRAZOLE 2 MG PO TABS
5.0000 mg | ORAL_TABLET | Freq: Every day | ORAL | Status: DC
  Administered 2023-04-13: 5 mg via ORAL
  Filled 2023-04-11: qty 1
  Filled 2023-04-11: qty 3

## 2023-04-11 MED ORDER — CLONAZEPAM 1 MG PO TABS
1.0000 mg | ORAL_TABLET | Freq: Two times a day (BID) | ORAL | Status: DC | PRN
  Administered 2023-04-12: 1 mg via ORAL
  Filled 2023-04-11: qty 1

## 2023-04-11 MED ORDER — TRAZODONE HCL 100 MG PO TABS
100.0000 mg | ORAL_TABLET | Freq: Every day | ORAL | Status: DC
  Administered 2023-04-12: 100 mg via ORAL
  Filled 2023-04-11 (×2): qty 1

## 2023-04-11 MED ORDER — NORETHINDRONE ACET-ETHINYL EST 1-20 MG-MCG PO TABS: 1.0000 | ORAL_TABLET | Freq: Every day | ORAL | Status: DC

## 2023-04-11 MED ORDER — FLUOXETINE HCL 20 MG PO CAPS
40.0000 mg | ORAL_CAPSULE | Freq: Every day | ORAL | Status: DC
  Administered 2023-04-13: 40 mg via ORAL
  Filled 2023-04-11: qty 2

## 2023-04-11 MED ORDER — CEFAZOLIN SODIUM-DEXTROSE 2-4 GM/100ML-% IV SOLN
2.0000 g | INTRAVENOUS | Status: AC
  Administered 2023-04-12: 2 g via INTRAVENOUS

## 2023-04-11 NOTE — Assessment & Plan Note (Signed)
-   Continue home clonazepam.  PDMP reviewed and appropriate

## 2023-04-11 NOTE — ED Provider Notes (Signed)
[facility] Provider Note    Event Date/Time   First MD Initiated Contact with Patient 04/11/23 1712     (approximate)   History   Hip Pain   HPI  Joanna Walker is a 47 y.o. female who presents to the emergency department for known hip fracture.  Patient reports that 2 weeks ago she was playing with her grandchild and felt a sharp pain in her left hip.  She reports that she has been having very difficult time weightbearing and has been having to use a walker to help with her pain.  She reports that she has been walking on her toes with her left foot because she cannot put weight directly on her left leg.  Her pain radiates into her left groin.  She denies back pain, abdominal pain, numbness, tingling, or weakness.  She denies any previous injuries to her hip.  No previous hip surgeries.  Patient Active Problem List   Diagnosis Date Noted   Femur fracture (HCC) 04/11/2023   Chest pain 10/20/2013   Allergic rhinitis 09/28/2013   Positive urine drug screen 08/16/2013   ADD (attention deficit disorder) 02/23/2013   Migraine with aura 01/13/2013   Anxiety and depression    Allergy           Physical Exam   Triage Vital Signs: ED Triage Vitals [04/11/23 1617]  Encounter Vitals Group     BP (!) 144/92     Systolic BP Percentile      Diastolic BP Percentile      Pulse Rate (!) 114     Resp 19     Temp 98.5 F (36.9 C)     Temp Source Oral     SpO2 96 %     Weight 169 lb (76.7 kg)     Height 5\' 3"  (1.6 m)     Head Circumference      Peak Flow      Pain Score 7     Pain Loc      Pain Education      Exclude from Growth Chart     Most recent vital signs: Vitals:   04/11/23 1617  BP: (!) 144/92  Pulse: (!) 114  Resp: 19  Temp: 98.5 F (36.9 C)  SpO2: 96%    Physical Exam Vitals and nursing note reviewed.  Constitutional:      General: Awake and alert. No acute distress.    Appearance: Normal appearance. The patient is  overweight.  HENT:     Head: Normocephalic and atraumatic.     Mouth: Mucous membranes are moist.  Eyes:     General: PERRL. Normal EOMs        Right eye: No discharge.        Left eye: No discharge.     Conjunctiva/sclera: Conjunctivae normal.  Cardiovascular:     Rate and Rhythm: Normal rate and regular rhythm.     Pulses: Normal pulses.  Pulmonary:     Effort: Pulmonary effort is normal. No respiratory distress.     Breath sounds: Normal breath sounds.  Abdominal:     Abdomen is soft. There is no abdominal tenderness. No rebound or guarding. No distention. Musculoskeletal:        General: No swelling. Normal range of motion.     Cervical back: Normal range of motion and neck supple.  Positive logroll to left hip, pain with any attempted active or passive range of motion of left hip.  Skin:    General: Skin is warm and dry.     Capillary Refill: Capillary refill takes less than 2 seconds.     Findings: No rash.  Neurological:     Mental Status: The patient is awake and alert.      ED Results / Procedures / Treatments   Labs (all labs ordered are listed, but only abnormal results are displayed) Labs Reviewed  CBC WITH DIFFERENTIAL/PLATELET  BASIC METABOLIC PANEL  PROTIME-INR  TYPE AND SCREEN     EKG     RADIOLOGY I independently reviewed and interpreted imaging and agree with radiologists findings.     PROCEDURES:  Critical Care performed:   Procedures   MEDICATIONS ORDERED IN ED: Medications  ceFAZolin (ANCEF) IVPB 2g/100 mL premix (has no administration in time range)     IMPRESSION / MDM / ASSESSMENT AND PLAN / ED COURSE  I reviewed the triage vital signs and the nursing notes.   Differential diagnosis includes, but is not limited to, hip fracture, muscle strain, labral tear.  I reviewed the patient's chart.  She has been seen multiple times by orthopedics for left hip pain.  She had an MRI scheduled yesterday.  She had been using a walker  which was unusual for her and was unable to tolerate putting pressure on her left hip.  Patient is awake and alert, hemodynamically stable and afebrile.  She is nontoxic in appearance.  I discussed with orthopedics on-call who reviewed her MRI and has asked for her to be admitted to the hospitalist service.  Preop labs and EKG ordered.  Patient was instructed to remain n.p.o. at midnight.  Patient was accepted by the hospitalist.  Patient's presentation is most consistent with acute presentation with potential threat to life or bodily function.    FINAL CLINICAL IMPRESSION(S) / ED DIAGNOSES   Final diagnoses:  Closed nondisplaced intertrochanteric fracture of left femur, initial encounter (HCC)     Rx / DC Orders   ED Discharge Orders     None        Note:  This document was prepared using Dragon voice recognition software and may include unintentional dictation errors.   Jackelyn Hoehn, PA-C 04/11/23 Thurston Pounds, MD 04/16/23 1057

## 2023-04-11 NOTE — ED Triage Notes (Signed)
Pt sts that she has a fx left hip and her orthopedic provider advised her to come and be admitted to the ED for surgery tomorrow.

## 2023-04-11 NOTE — H&P (Signed)
History and Physical    Patient: Joanna Walker NWG:[id number] DOB: 03/09/1976 DOA: 04/11/2023 DOS: the patient was seen and examined on 04/11/2023 PCP: Pcp, No  Patient coming from: Home  Chief Complaint:  Chief Complaint  Patient presents with   Hip Pain   HPI: Joanna Walker is a 47 y.o. female with no significant medical history presenting to the ED due to left hip pain.  Joanna Walker states that she began to experience left hip pain that was predominantly anterior, approximately 3-4 months ago.  It began gradually without any trauma or falls.  Then approximately 2 weeks ago, she was getting up from playing on the ground with her granddaughter when she had sudden worsening in her pain.  She especially noticed it later that evening as she was unable to place any weight on the leg.  Since then, she has had to walk with a walker as she cannot bear weight on her left leg.  She denies any paresthesias. She denies any other concerns at this time.  She had an MRI yesterday and was instructed to come to the ED for surgery.  ED course: On arrival to the ED, patient was hypertensive at 144/92 with heart rate of 114.  She was saturating at 96% on room air.  She was afebrile at 98.5.  Given results of MRI obtained yesterday demonstrating a left intertrochanteric fracture, orthopedic surgery was consulted with plans for the OR tomorrow.  [facility] contacted for admission.  Review of Systems: As mentioned in the history of present illness. All other systems reviewed and are negative.  Past Medical History:  Diagnosis Date   ADD (attention deficit disorder)    Allergy    Anxiety    Asthma    Depression    Past Surgical History:  Procedure Laterality Date   CHOLECYSTECTOMY     CHOLECYSTECTOMY     Social History:  reports that she has never smoked. She has never used smokeless tobacco. She reports current alcohol use. She reports that she does not use drugs.  Allergies   Allergen Reactions   Morphine Nausea And Vomiting    Family History  Adopted: Yes    Prior to Admission medications   Medication Sig Start Date End Date Taking? Authorizing Provider  clonazePAM (KLONOPIN) 1 MG tablet SMARTSIG:1 Tablet(s) By Mouth Every 12 Hours PRN 11/03/19   [provider]  fluticasone (FLONASE) 50 MCG/ACT nasal spray TWO PUFFS IN EACH NOSTRIL ONCE A DAY 06/28/22   Mayers, Cari S, PA-C  Multiple Vitamins-Minerals (MULTIVITAMIN PO) Take 1 tablet by mouth daily.    [provider]  neomycin-polymyxin-hydrocortisone (CORTISPORIN) OTIC solution Place 3 drops into the right ear 4 (four) times daily. For 7 days 12/26/22   Margaretann Loveless, PA-C  norethindrone-ethinyl estradiol (LOESTRIN) 1-20 MG-MCG tablet Take 1 tablet by mouth daily. 02/03/23   Linzie Collin, MD  Omega-3 Fatty Acids (FISH OIL) 1000 MG CAPS Take 1 capsule by mouth 2 (two) times daily.    [provider]  omeprazole (PRILOSEC) 20 MG capsule Take 20 mg by mouth daily. 10/26/19   [provider]  ondansetron (ZOFRAN-ODT) 4 MG disintegrating tablet Take 1 tablet (4 mg total) by mouth every 8 (eight) hours as needed for nausea or vomiting. 06/15/22   Sharman Cheek, MD  polyethylene glycol powder [facility]) 17 GM/SCOOP powder Take by mouth. 11/14/19   [provider]  topiramate (TOPAMAX) 50 MG tablet Take 50 mg by mouth 2 (two) times daily.  11/04/19   [provider]  traZODone (DESYREL) 100 MG tablet  09/23/19   [provider]  vitamin C (ASCORBIC ACID) 500 MG tablet Take 500 mg by mouth 2 (two) times daily.    [provider]  buPROPion (WELLBUTRIN XL) 150 MG 24 hr tablet Take 1 tablet (150 mg total) by mouth daily. 07/06/13 08/18/15  Dianne Dun, MD    Physical Exam: Vitals:   04/11/23 1617 04/11/23 1945 04/11/23 2030  BP: (!) 144/92  123/83  Pulse: (!) 114 99 92  Resp: 19  17  Temp: 98.5 F (36.9 C)  98.6 F (37 C)   TempSrc: Oral  Oral  SpO2: 96% 97% 95%  Weight: 76.7 kg    Height: 5\' 3"  (1.6 m)     Physical Exam Vitals and nursing note reviewed.  Constitutional:      General: She is not in acute distress. HENT:     Head: Normocephalic and atraumatic.     Mouth/Throat:     Mouth: Mucous membranes are moist.  Cardiovascular:     Rate and Rhythm: Normal rate and regular rhythm.     Pulses:          Dorsalis pedis pulses are 2+ on the right side and 2+ on the left side.     Heart sounds: No murmur heard.    No gallop.  Pulmonary:     Effort: Pulmonary effort is normal. No respiratory distress.     Breath sounds: Wheezing (minimal exp wheeze in left upper lung field) present.  Musculoskeletal:     Right lower leg: No edema.     Left lower leg: No edema.     Comments: Left leg minimally shortened compared to left. No external rotation noted  Skin:    General: Skin is warm and dry.  Neurological:     Mental Status: She is alert and oriented to person, place, and time. Mental status is at baseline.  Psychiatric:        Mood and Affect: Mood normal.        Behavior: Behavior normal.    Data Reviewed: CBC with WBC of 7.3, hemoglobin of 13.5, platelets of 292 Sodium of 136, potassium 3.5, bicarb 22, glucose 104, creatinine 0.76 with GFR > 60  EKG pending  Official read of left femur MRI pending  Results are pending, will review when available.  Assessment and Plan:  * Femur fracture [facility]) Per chart review, patient first was seen on September 25 for left hip pain that have been occurring for approximately 3 months.  Then approximately 2 weeks ago, she had sudden exacerbation of pain.  MRI was obtained yesterday with results notable for intertrochanteric fracture.  - Orthopedic surgery consulted; appreciate their recommendations - Plan for the OR tomorrow - N.p.o. after midnight - Tylenol, oxycodone and Dilaudid for pain control  ADD (attention deficit disorder) - Hold home Adderall  and Adzenys while admitted  Anxiety and depression - Continue home clonazepam.  PDMP reviewed and appropriate  Advance Care Planning:   Code Status: Full Code   Consults: Orthopedic surgery  Family Communication: Patient's boyfriend updated at bedside  Severity of Illness: The appropriate patient status for this patient is INPATIENT. Inpatient status is judged to be reasonable and necessary in order to provide the required intensity of service to ensure the patient's safety. The patient's presenting symptoms, physical exam findings, and initial radiographic and laboratory data in the context of their chronic comorbidities is felt to  place them at high risk for further clinical deterioration. Furthermore, it is not anticipated that the patient will be medically stable for discharge from the hospital within 2 midnights of admission.   * I certify that at the point of admission it is my clinical judgment that the patient will require inpatient hospital care spanning beyond 2 midnights from the point of admission due to high intensity of service, high risk for further deterioration and high frequency of surveillance required.*  Author: Verdene Lennert, MD 04/11/2023 8:40 PM  For on call review [email or web address].

## 2023-04-11 NOTE — Assessment & Plan Note (Signed)
Per chart review, patient first was seen on September 25 for left hip pain that have been occurring for approximately 3 months.  Then approximately 2 weeks ago, she had sudden exacerbation of pain.  MRI was obtained yesterday with results notable for intertrochanteric fracture.  - Orthopedic surgery consulted; appreciate their recommendations - Plan for the OR tomorrow - N.p.o. after midnight - Tylenol, oxycodone and Dilaudid for pain control

## 2023-04-11 NOTE — Assessment & Plan Note (Signed)
-   Hold home Adderall and Adzenys while admitted

## 2023-04-11 NOTE — ED Notes (Signed)
Pt states pain is 7/10 offered pain medication that is ordered PRN. Pt request nausea medication to accompany pain med. Hospitalist contacted for nausea medication order. Pt ate a portion of dinner and stated was not very hungry as was eating dinner when she received call to come to ER. Pt aware nothing to eat or drink after midnight tonight.

## 2023-04-12 ENCOUNTER — Encounter
Admission: EM | Disposition: A | Discharge status: Home Health | Payer: Self-pay | Source: Home / Self Care | Attending: Internal Medicine

## 2023-04-12 ENCOUNTER — Inpatient Hospital Stay: Payer: BLUE CROSS/BLUE SHIELD | Admitting: Certified Registered"

## 2023-04-12 ENCOUNTER — Inpatient Hospital Stay: Payer: BLUE CROSS/BLUE SHIELD

## 2023-04-12 ENCOUNTER — Other Ambulatory Visit: Payer: Self-pay

## 2023-04-12 DIAGNOSIS — F909 Attention-deficit hyperactivity disorder, unspecified type: Secondary | ICD-10-CM

## 2023-04-12 DIAGNOSIS — F32A Depression, unspecified: Secondary | ICD-10-CM

## 2023-04-12 DIAGNOSIS — F419 Anxiety disorder, unspecified: Secondary | ICD-10-CM

## 2023-04-12 DIAGNOSIS — S72145A Nondisplaced intertrochanteric fracture of left femur, initial encounter for closed fracture: Secondary | ICD-10-CM | POA: Diagnosis not present

## 2023-04-12 HISTORY — PX: INTRAMEDULLARY (IM) NAIL INTERTROCHANTERIC: SHX5875

## 2023-04-12 LAB — HIV ANTIBODY (ROUTINE TESTING W REFLEX): HIV Screen 4th Generation wRfx: NONREACTIVE

## 2023-04-12 SURGERY — FIXATION, FRACTURE, INTERTROCHANTERIC, WITH INTRAMEDULLARY ROD
Anesthesia: Choice | Laterality: Left

## 2023-04-12 SURGERY — FIXATION, FRACTURE, INTERTROCHANTERIC, WITH INTRAMEDULLARY ROD
Anesthesia: Spinal | Site: Hip | Laterality: Left

## 2023-04-12 MED ORDER — MIDAZOLAM HCL 5 MG/5ML IJ SOLN
INTRAMUSCULAR | Status: DC | PRN
  Administered 2023-04-12: 2 mg via INTRAVENOUS

## 2023-04-12 MED ORDER — ACETAMINOPHEN 325 MG PO TABS: 325.0000 mg | ORAL_TABLET | Freq: Four times a day (QID) | ORAL | Status: DC | PRN

## 2023-04-12 MED ORDER — ENSURE ENLIVE PO LIQD: 237.0000 mL | Freq: Two times a day (BID) | ORAL | Status: DC

## 2023-04-12 MED ORDER — FENTANYL CITRATE (PF) 100 MCG/2ML IJ SOLN: 25.0000 ug | INTRAMUSCULAR | Status: DC | PRN

## 2023-04-12 MED ORDER — ENOXAPARIN SODIUM 40 MG/0.4ML IJ SOSY
40.0000 mg | PREFILLED_SYRINGE | INTRAMUSCULAR | Status: DC
  Administered 2023-04-13: 40 mg via SUBCUTANEOUS
  Filled 2023-04-12: qty 0.4

## 2023-04-12 MED ORDER — PROPOFOL 1000 MG/100ML IV EMUL
INTRAVENOUS | Status: AC
  Filled 2023-04-12: qty 100

## 2023-04-12 MED ORDER — LACTATED RINGERS IV SOLN: INTRAVENOUS | Status: DC | PRN

## 2023-04-12 MED ORDER — CEFAZOLIN SODIUM-DEXTROSE 2-4 GM/100ML-% IV SOLN
INTRAVENOUS | Status: AC
  Filled 2023-04-12: qty 100

## 2023-04-12 MED ORDER — PHENYLEPHRINE HCL-NACL 20-0.9 MG/250ML-% IV SOLN
INTRAVENOUS | Status: AC
  Filled 2023-04-12: qty 250

## 2023-04-12 MED ORDER — CEFAZOLIN SODIUM-DEXTROSE 2-4 GM/100ML-% IV SOLN
2.0000 g | Freq: Four times a day (QID) | INTRAVENOUS | Status: AC
  Administered 2023-04-12 – 2023-04-13 (×3): 2 g via INTRAVENOUS
  Filled 2023-04-12 (×4): qty 100

## 2023-04-12 MED ORDER — BUPIVACAINE-EPINEPHRINE (PF) 0.5% -1:200000 IJ SOLN
INTRAMUSCULAR | Status: DC | PRN
  Administered 2023-04-12: 30 mL via INTRAMUSCULAR

## 2023-04-12 MED ORDER — PHENYLEPHRINE HCL-NACL 20-0.9 MG/250ML-% IV SOLN
INTRAVENOUS | Status: DC | PRN
  Administered 2023-04-12: 40 ug/min via INTRAVENOUS

## 2023-04-12 MED ORDER — OXYCODONE HCL 5 MG PO TABS
5.0000 mg | ORAL_TABLET | Freq: Once | ORAL | Status: AC | PRN
  Administered 2023-04-12: 5 mg via ORAL

## 2023-04-12 MED ORDER — ADULT MULTIVITAMIN W/MINERALS CH
1.0000 | ORAL_TABLET | Freq: Every day | ORAL | Status: DC
  Administered 2023-04-13: 1 via ORAL
  Filled 2023-04-12: qty 1

## 2023-04-12 MED ORDER — OXYCODONE HCL 5 MG/5ML PO SOLN: 5.0000 mg | Freq: Once | ORAL | Status: AC | PRN

## 2023-04-12 MED ORDER — POLYETHYLENE GLYCOL 3350 17 G PO PACK
17.0000 g | PACK | Freq: Every day | ORAL | Status: DC | PRN
Start: 2023-04-12 — End: 2023-04-13

## 2023-04-12 MED ORDER — ACETAMINOPHEN 500 MG PO TABS
1000.0000 mg | ORAL_TABLET | Freq: Four times a day (QID) | ORAL | Status: DC
  Administered 2023-04-12 – 2023-04-13 (×3): 1000 mg via ORAL
  Filled 2023-04-12 (×3): qty 2

## 2023-04-12 MED ORDER — PROPOFOL 500 MG/50ML IV EMUL
INTRAVENOUS | Status: DC | PRN
  Administered 2023-04-12: 125 ug/kg/min via INTRAVENOUS

## 2023-04-12 MED ORDER — DIPHENHYDRAMINE HCL 12.5 MG/5ML PO ELIX: 12.5000 mg | ORAL_SOLUTION | ORAL | Status: DC | PRN

## 2023-04-12 MED ORDER — ACETAMINOPHEN 10 MG/ML IV SOLN
INTRAVENOUS | Status: AC
  Filled 2023-04-12: qty 100

## 2023-04-12 MED ORDER — METOCLOPRAMIDE HCL 5 MG/ML IJ SOLN: 5.0000 mg | Freq: Three times a day (TID) | INTRAMUSCULAR | Status: DC | PRN

## 2023-04-12 MED ORDER — DOCUSATE SODIUM 100 MG PO CAPS
100.0000 mg | ORAL_CAPSULE | Freq: Two times a day (BID) | ORAL | Status: DC
  Administered 2023-04-12 – 2023-04-13 (×2): 100 mg via ORAL
  Filled 2023-04-12 (×2): qty 1

## 2023-04-12 MED ORDER — ONDANSETRON HCL 4 MG/2ML IJ SOLN: 4.0000 mg | Freq: Four times a day (QID) | INTRAMUSCULAR | Status: DC | PRN

## 2023-04-12 MED ORDER — PROPOFOL 10 MG/ML IV BOLUS
INTRAVENOUS | Status: DC | PRN
  Administered 2023-04-12: 60 mg via INTRAVENOUS

## 2023-04-12 MED ORDER — METOCLOPRAMIDE HCL 5 MG PO TABS: 5.0000 mg | ORAL_TABLET | Freq: Three times a day (TID) | ORAL | Status: DC | PRN

## 2023-04-12 MED ORDER — DEXTROSE-SODIUM CHLORIDE 5-0.9 % IV SOLN: INTRAVENOUS | Status: DC

## 2023-04-12 MED ORDER — DEXTROSE-SODIUM CHLORIDE 5-0.2 % IV SOLN
INTRAVENOUS | Status: AC
  Filled 2023-04-12: qty 1000

## 2023-04-12 MED ORDER — ACETAMINOPHEN 10 MG/ML IV SOLN
INTRAVENOUS | Status: DC | PRN
  Administered 2023-04-12: 1000 mg via INTRAVENOUS

## 2023-04-12 MED ORDER — FLEET ENEMA RE ENEM: 1.0000 | ENEMA | Freq: Once | RECTAL | Status: DC | PRN

## 2023-04-12 MED ORDER — BISACODYL 10 MG RE SUPP: 10.0000 mg | Freq: Every day | RECTAL | Status: DC | PRN

## 2023-04-12 MED ORDER — OXYCODONE HCL 5 MG PO TABS
ORAL_TABLET | ORAL | Status: AC
  Filled 2023-04-12: qty 1

## 2023-04-12 MED ORDER — MIDAZOLAM HCL 2 MG/2ML IJ SOLN
INTRAMUSCULAR | Status: AC
  Filled 2023-04-12: qty 2

## 2023-04-12 MED ORDER — 0.9 % SODIUM CHLORIDE (POUR BTL) OPTIME
TOPICAL | Status: DC | PRN
  Administered 2023-04-12: 500 mL

## 2023-04-12 MED ORDER — ONDANSETRON HCL 4 MG PO TABS: 4.0000 mg | ORAL_TABLET | Freq: Four times a day (QID) | ORAL | Status: DC | PRN

## 2023-04-12 SURGICAL SUPPLY — 48 items
APL PRP STRL LF DISP 70% ISPRP (MISCELLANEOUS) ×2
BIT DRILL CANN LG 4.3MM (BIT) IMPLANT
BNDG CMPR 5X4 CHSV STRCH STRL (GAUZE/BANDAGES/DRESSINGS) ×1
BNDG CMPR 5X6 CHSV STRCH STRL (GAUZE/BANDAGES/DRESSINGS) ×1
BNDG COHESIVE 4X5 TAN STRL LF (GAUZE/BANDAGES/DRESSINGS) ×1 IMPLANT
BNDG COHESIVE 6X5 TAN ST LF (GAUZE/BANDAGES/DRESSINGS) ×1 IMPLANT
CHLORAPREP W/TINT 26 (MISCELLANEOUS) ×2 IMPLANT
DRAPE C-ARMOR (DRAPES) ×1 IMPLANT
DRAPE SHEET LG 3/4 BI-LAMINATE (DRAPES) ×1 IMPLANT
DRILL BIT CANN LG 4.3MM (BIT) ×1
DRSG MEPILEX SACRM 8.7X9.8 (GAUZE/BANDAGES/DRESSINGS) ×1 IMPLANT
DRSG OPSITE POSTOP 3X4 (GAUZE/BANDAGES/DRESSINGS) IMPLANT
DRSG OPSITE POSTOP 4X6 (GAUZE/BANDAGES/DRESSINGS) IMPLANT
ELECT CAUTERY BLADE 6.4 (BLADE) ×1 IMPLANT
ELECT REM PT RETURN 9FT ADLT (ELECTROSURGICAL) ×1
ELECTRODE REM PT RTRN 9FT ADLT (ELECTROSURGICAL) ×1 IMPLANT
GAUZE SPONGE 4X4 12PLY STRL (GAUZE/BANDAGES/DRESSINGS) ×1 IMPLANT
GLOVE BIO SURGEON STRL SZ8 (GLOVE) ×2 IMPLANT
GLOVE INDICATOR 8.0 STRL GRN (GLOVE) ×1 IMPLANT
GOWN STRL REUS W/ TWL LRG LVL3 (GOWN DISPOSABLE) ×1 IMPLANT
GOWN STRL REUS W/ TWL XL LVL3 (GOWN DISPOSABLE) ×1 IMPLANT
GOWN STRL REUS W/TWL LRG LVL3 (GOWN DISPOSABLE) ×1
GOWN STRL REUS W/TWL XL LVL3 (GOWN DISPOSABLE) ×1
GUIDEPIN VERSANAIL DSP 3.2X444 (ORTHOPEDIC DISPOSABLE SUPPLIES) IMPLANT
GUIDEWIRE BALL NOSE 100CM (WIRE) IMPLANT
HANDLE YANKAUER SUCT OPEN TIP (MISCELLANEOUS) ×1 IMPLANT
HIP FRA NAIL LAG SCREW 10.5X90 (Orthopedic Implant) ×1 IMPLANT
MANIFOLD NEPTUNE II (INSTRUMENTS) ×1 IMPLANT
MAT ABSORB FLUID 56X50 GRAY (MISCELLANEOUS) ×1 IMPLANT
NAIL HIP FRACT 130D 9X180 (Orthopedic Implant) IMPLANT
NDL FILTER BLUNT 18X1 1/2 (NEEDLE) ×1 IMPLANT
NDL HYPO 22X1.5 SAFETY MO (MISCELLANEOUS) ×1 IMPLANT
NEEDLE FILTER BLUNT 18X1 1/2 (NEEDLE) ×1 IMPLANT
NEEDLE HYPO 22X1.5 SAFETY MO (MISCELLANEOUS) ×1 IMPLANT
NS IRRIG 500ML POUR BTL (IV SOLUTION) ×1 IMPLANT
PACK HIP COMPR (MISCELLANEOUS) ×1 IMPLANT
SCREW BONE CORTICAL 5.0X38 (Screw) IMPLANT
SCREW LAG HIP FRA NAIL 10.5X90 (Orthopedic Implant) IMPLANT
STAPLER SKIN PROX 35W (STAPLE) ×1 IMPLANT
STRAP SAFETY 5IN WIDE (MISCELLANEOUS) ×1 IMPLANT
SUT VIC AB 0 CT1 36 (SUTURE) ×1 IMPLANT
SUT VIC AB 1 CT1 36 (SUTURE) ×1 IMPLANT
SUT VIC AB 2-0 CT1 (SUTURE) ×2 IMPLANT
SYR 10ML LL (SYRINGE) ×1 IMPLANT
SYR 30ML LL (SYRINGE) ×1 IMPLANT
TAPE MICROFOAM 4IN (TAPE) ×1 IMPLANT
TRAP FLUID SMOKE EVACUATOR (MISCELLANEOUS) ×1 IMPLANT
WATER STERILE IRR 500ML POUR (IV SOLUTION) ×1 IMPLANT

## 2023-04-12 NOTE — Consult Note (Signed)
ORTHOPAEDIC CONSULTATION  REQUESTING PHYSICIAN: Delfino Lovett, MD  Chief Complaint:   Left hip/groin pain.  History of Present Illness: Joanna Walker is a 47 y.o. female with a history of asthma, anxiety/depression, and attention deficit disorder who lives independently and normally ambulates without any assistive devices noticed the development of some left hip/groin pain 4-5 months ago without any apparent injury.  She continued to try to do her job and perform her normal daily activities despite the pain, but her symptoms persisted so she saw her primary care provider.  She was diagnosed with trochanteric bursitis and given a steroid injection which did not provide any substantial relief of her symptoms.  Because of continued symptoms, she was seen at [facility] urgent care clinic 1 month ago.  X-rays of her pelvis and left hip apparently were ordered but did not show any obvious fracture.  She was given a 12-day course of a steroid Dosepak and a muscle relaxer, and advised to follow up with orthopedics.  She was seen by orthopedics on 03/24/2023.  As the plain radiographs were unremarkable, she was sent for an MRI scan of the pelvis and left hip.  Before this test could be done, she developed worsening pain in her hip upon getting up after playing with her grandson on the floor.  Since then, she has been been relegated to using a walker to for ambulation as it has been too painful for her to put any weight on her left lower extremity.  The MRI scan was completed 2 days ago and demonstrated an incomplete basicervical/intertrochanteric left hip fracture extending from the superior aspect of the femur toward the left trochanter, but had not yet breached the calcar.  Therefore, the patient was advised to go to the emergency room for urgent management of this impending left hip fracture.  Past Medical History:  Diagnosis Date    ADD (attention deficit disorder)    Allergy    Anxiety    Asthma    Depression    Past Surgical History:  Procedure Laterality Date   CHOLECYSTECTOMY     CHOLECYSTECTOMY     Social History   Socioeconomic History   Marital status: Legally Separated    Spouse name: Not on file   Number of children: Not on file   Years of education: Not on file   Highest education level: Not on file  Occupational History   Not on file  Tobacco Use   Smoking status: Never   Smokeless tobacco: Never  Substance and Sexual Activity   Alcohol use: Yes    Comment: rare   Drug use: No   Sexual activity: Yes    Birth control/protection: Pill  Other Topics Concern   Not on file  Social History Narrative   Not on file   Social Determinants of Health   Financial Resource Strain: Low Risk  (04/05/2023)   Received from [facility]   Overall Financial Resource Strain (CARDIA)    Difficulty of Paying Living Expenses: Not very hard  Food Insecurity: No Food Insecurity (04/11/2023)   Hunger Vital Sign    Worried About Running Out of Food in the Last Year: Never true    Ran Out of Food in the Last Year: Never true  Transportation Needs: No Transportation Needs (04/11/2023)   PRAPARE - Administrator, Civil Service (Medical): No    Lack of Transportation (Non-Medical): No  Physical Activity: Not on file  Stress: Not on file  Social Connections: Not on file   Family History  Adopted: Yes   Allergies  Allergen Reactions   Morphine Nausea And Vomiting   Prior to Admission medications   Medication Sig Start Date End Date Taking? Authorizing Provider  ARIPiprazole (ABILIFY) 5 MG tablet Take by mouth. 02/13/23  Yes [provider]  clonazePAM (KLONOPIN) 1 MG tablet SMARTSIG:1 Tablet(s) By Mouth Every 12 Hours PRN 11/03/19   [provider]  FLUoxetine (PROZAC) 40 MG capsule Take 40 mg by mouth daily.    [provider]  fluticasone  (FLONASE) 50 MCG/ACT nasal spray TWO PUFFS IN EACH NOSTRIL ONCE A DAY 06/28/22   Mayers, Cari S, PA-C  Multiple Vitamins-Minerals (MULTIVITAMIN PO) Take 1 tablet by mouth daily.    [provider]  neomycin-polymyxin-hydrocortisone (CORTISPORIN) OTIC solution Place 3 drops into the right ear 4 (four) times daily. For 7 days 12/26/22   Margaretann Loveless, PA-C  norethindrone-ethinyl estradiol (LOESTRIN) 1-20 MG-MCG tablet Take 1 tablet by mouth daily. 02/03/23   Linzie Collin, MD  Omega-3 Fatty Acids (FISH OIL) 1000 MG CAPS Take 1 capsule by mouth 2 (two) times daily.    [provider]  omeprazole (PRILOSEC) 20 MG capsule Take 20 mg by mouth daily. 10/26/19   [provider]  ondansetron (ZOFRAN-ODT) 4 MG disintegrating tablet Take 1 tablet (4 mg total) by mouth every 8 (eight) hours as needed for nausea or vomiting. 06/15/22   Sharman Cheek, MD  polyethylene glycol powder [facility]) 17 GM/SCOOP powder Take by mouth. 11/14/19   [provider]  topiramate (TOPAMAX) 50 MG tablet Take 50 mg by mouth 2 (two) times daily. 11/04/19   [provider]  traZODone (DESYREL) 100 MG tablet  09/23/19   [provider]  vitamin C (ASCORBIC ACID) 500 MG tablet Take 500 mg by mouth 2 (two) times daily.    [provider]  buPROPion (WELLBUTRIN XL) 150 MG 24 hr tablet Take 1 tablet (150 mg total) by mouth daily. 07/06/13 08/18/15  Dianne Dun, MD   No results found.  Positive ROS: All other systems have been reviewed and were otherwise negative with the exception of those mentioned in the HPI and as above.  Physical Exam: General:  Alert, no acute distress Psychiatric:  Patient is competent for consent with normal mood and affect   Cardiovascular:  No pedal edema Respiratory:  No wheezing, non-labored breathing GI:  Abdomen is soft and non-tender Skin:  No lesions in the area of chief complaint Neurologic:  Sensation intact  distally Lymphatic:  No axillary or cervical lymphadenopathy  Orthopedic Exam:  Orthopedic examination is limited to the left hip and lower extremity.  The left lower extremity is symmetrically aligned to the right.  Skin inspection around the left hip is unremarkable.  No swelling, erythema, ecchymosis, abrasions, or other skin abnormalities are identified.  She has no tenderness to palpation over the lateral aspect of the hip.  She has more moderate to severe pain with attempted active range of motion or with passive range of motion of the hip/thigh.  She is neurovascularly intact to the left lower extremity and foot.  X-rays:  A recent MRI scan of the left hip is available for review.  The findings are as described above.  Assessment: Impending nondisplaced left basicervical/intertrochanteric hip fracture.  Plan: The treatment options, including both surgical and nonsurgical choices, have been discussed in detail with the patient and his/her family.  The patient and her husband  would like to proceed with surgical intervention to include an intramedullary nailing of the impending left hip fracture.  The risks (including bleeding, infection, nerve and/or blood vessel injury, persistent or recurrent pain, loosening or failure of the components, malunion and/or nonunion, need for further surgery, blood clots, strokes, heart attacks or arrhythmias, pneumonia, etc.) and benefits of the surgical procedure were discussed.  The patient states her understanding and agrees to proceed.  A formal written consent will be obtained by the nursing staff.  Thank you for asking me to participate in the care of this most pleasant and unfortunate woman.  I will be happy to follow her with you.   Maryagnes Amos, MD  Beeper #:  [phone]  04/12/2023 10:25 AM

## 2023-04-12 NOTE — Plan of Care (Signed)
  Problem: Education: Goal: Knowledge of General Education information will improve Description: Including pain rating scale, medication(s)/side effects and non-pharmacologic comfort measures 04/12/2023 0405 by Dorthula Nettles, RN Outcome: Progressing 04/12/2023 0016 by Dorthula Nettles, RN Outcome: Not Progressing   Problem: Health Behavior/Discharge Planning: Goal: Ability to manage health-related needs will improve Outcome: Progressing   Problem: Clinical Measurements: Goal: Ability to maintain clinical measurements within normal limits will improve 04/12/2023 0405 by Dorthula Nettles, RN Outcome: Progressing 04/12/2023 0016 by Dorthula Nettles, RN Outcome: Progressing   Problem: Activity: Goal: Risk for activity intolerance will decrease Outcome: Progressing   Problem: Nutrition: Goal: Adequate nutrition will be maintained Outcome: Progressing   Problem: Elimination: Goal: Will not experience complications related to bowel motility Outcome: Progressing   Problem: Pain Management: Goal: General experience of comfort will improve Outcome: Progressing   Problem: Skin Integrity: Goal: Risk for impaired skin integrity will decrease Outcome: Progressing

## 2023-04-12 NOTE — Plan of Care (Signed)
  Problem: Clinical Measurements: Goal: Ability to maintain clinical measurements within normal limits will improve Outcome: Progressing   Problem: Activity: Goal: Risk for activity intolerance will decrease Outcome: Progressing   Problem: Nutrition: Goal: Adequate nutrition will be maintained Outcome: Progressing   Problem: Elimination: Goal: Will not experience complications related to bowel motility Outcome: Progressing   Problem: Pain Management: Goal: General experience of comfort will improve Outcome: Progressing   Problem: Skin Integrity: Goal: Risk for impaired skin integrity will decrease Outcome: Progressing   Problem: Education: Goal: Knowledge of General Education information will improve Description: Including pain rating scale, medication(s)/side effects and non-pharmacologic comfort measures Outcome: Not Progressing

## 2023-04-12 NOTE — Anesthesia Preprocedure Evaluation (Signed)
Anesthesia Evaluation  Patient identified by MRN, date of birth, ID band Patient awake    Reviewed: Allergy & Precautions, NPO status , Patient's Chart, lab work & pertinent test results  History of Anesthesia Complications Negative for: history of anesthetic complications  Airway Mallampati: IV  TM Distance: >3 FB Neck ROM: full  Mouth opening: Limited Mouth Opening  Dental no notable dental hx.    Pulmonary neg pulmonary ROS   Pulmonary exam normal        Cardiovascular negative cardio ROS Normal cardiovascular exam     Neuro/Psych  Headaches PSYCHIATRIC DISORDERS Anxiety Depression       GI/Hepatic negative GI ROS, Neg liver ROS,,,  Endo/Other  negative endocrine ROS    Renal/GU   negative genitourinary   Musculoskeletal   Abdominal   Peds  Hematology negative hematology ROS (+)   Anesthesia Other Findings Past Medical History: No date: ADD (attention deficit disorder) No date: Allergy No date: Anxiety No date: Asthma No date: Depression  Past Surgical History: No date: CHOLECYSTECTOMY No date: CHOLECYSTECTOMY  BMI    Body Mass Index: 29.94 kg/m      Reproductive/Obstetrics negative OB ROS                             Anesthesia Physical Anesthesia Plan  ASA: 2  Anesthesia Plan: Spinal   Post-op Pain Management: Toradol IV (intra-op)*, Ofirmev IV (intra-op)* and Regional block*   Induction: Intravenous  PONV Risk Score and Plan: 2 and Propofol infusion and TIVA  Airway Management Planned: Natural Airway and Nasal Cannula  Additional Equipment:   Intra-op Plan:   Post-operative Plan:   Informed Consent: I have reviewed the patients History and Physical, chart, labs and discussed the procedure including the risks, benefits and alternatives for the proposed anesthesia with the patient or authorized representative who has indicated his/her understanding and  acceptance.     Dental Advisory Given  Plan Discussed with: Anesthesiologist, CRNA and Surgeon  Anesthesia Plan Comments: (Patient reports no bleeding problems and no anticoagulant use.  Plan for spinal with backup GA  Patient consented for risks of anesthesia including but not limited to:  - adverse reactions to medications - damage to eyes, teeth, lips or other oral mucosa - nerve damage due to positioning  - risk of bleeding, infection and or nerve damage from spinal that could lead to paralysis - risk of headache or failed spinal - damage to teeth, lips or other oral mucosa - sore throat or hoarseness - damage to heart, brain, nerves, lungs, other parts of body or loss of life  Patient voiced understanding and assent.)        Anesthesia Quick Evaluation

## 2023-04-12 NOTE — Anesthesia Procedure Notes (Signed)
Spinal  Start time: 04/12/2023 11:08 AM End time: 04/12/2023 11:14 AM Reason for block: surgical anesthesia Staffing Performed: resident/CRNA  Anesthesiologist: Louie Boston, MD Resident/CRNA: Elmarie Mainland, CRNA Performed by: Elmarie Mainland, CRNA Authorized by: Louie Boston, MD   Preanesthetic Checklist Completed: patient identified, IV checked, site marked, risks and benefits discussed, surgical consent, monitors and equipment checked, pre-op evaluation and timeout performed Spinal Block Patient position: sitting Prep: ChloraPrep Patient monitoring: continuous pulse ox and blood pressure Approach: midline Location: L3-4 Injection technique: single-shot Needle Needle type: Pencan and Introducer  Assessment Sensory level: T10 Events: CSF return Additional Notes Negative parestheisa, negative heme.

## 2023-04-12 NOTE — Evaluation (Signed)
Physical Therapy Evaluation Patient Details Name: Joanna Walker MRN: [id number] DOB: Mar 09, 1976 Today's Date: 04/12/2023  History of Present Illness  47 year old female with a known history of asthma, anxiety, depression, ADHD admitted for impending left hip fracture      10/26: Status post nail fixation of impending left hip fracture. WBAT to LLE  Clinical Impression  Pt received in bed with BF by her side agreeable top PT interventions. Pt A and O x 4. Sensation and proprioception decreased. Pt dizzy with supine ot sit which resolved with time. Pt has not has Urinated or BM. Pt works FT where she needs to walk all day and drives. Pt needed Mod assist with supine to sit and CGA with transfers and gait with FWW. Pt educated and provided with Hip fracture handout fro exs every 3 hours. Pt demonstrates good understanding. PT will continue 7 x week in acute and pt will benefit from continued PT interventions outside acute care.       If plan is discharge home, recommend the following: A little help with walking and/or transfers;A little help with bathing/dressing/bathroom;Assistance with cooking/housework;Assist for transportation;Help with stairs or ramp for entrance   Can travel by private vehicle        Equipment Recommendations BSC/3in1  Recommendations for Other Services       Functional Status Assessment Patient has had a recent decline in their functional status and demonstrates the ability to make significant improvements in function in a reasonable and predictable amount of time.     Precautions / Restrictions Precautions Precautions: Fall Restrictions Weight Bearing Restrictions: Yes LLE Weight Bearing: Weight bearing as tolerated      Mobility  Bed Mobility Overal bed mobility: Needs Assistance Bed Mobility: Supine to Sit     Supine to sit: Mod assist     General bed mobility comments: to LLE only    Transfers Overall transfer level: Needs  assistance Equipment used: Rolling walker (2 wheels) Transfers: Sit to/from Stand, Bed to chair/wheelchair/BSC Sit to Stand: Contact guard assist Stand pivot transfers: Contact guard assist         General transfer comment: VC for proper sequencing.    Ambulation/Gait Ambulation/Gait assistance: Contact guard assist Gait Distance (Feet): 60 Feet Assistive device: Rolling walker (2 wheels) Gait Pattern/deviations: Step-to pattern Gait velocity: DEc     General Gait Details: slow, antalgic and decreased loading response to LLE>  Stairs            Wheelchair Mobility     Tilt Bed    Modified Rankin (Stroke Patients Only)       Balance Overall balance assessment: Needs assistance Sitting-balance support: Bilateral upper extremity supported Sitting balance-Leahy Scale: Good     Standing balance support: Bilateral upper extremity supported Standing balance-Leahy Scale: Good Standing balance comment: No LOB noted.                             Pertinent Vitals/Pain Pain Assessment Pain Assessment: 0-10 Pain Score: 8  Pain Location: L hip Pain Descriptors / Indicators: Aching Pain Intervention(s): Monitored during session, Patient requesting pain meds-RN notified    Home Living Family/patient expects to be discharged to:: Private residence Living Arrangements: Spouse/significant other Available Help at Discharge: Available PRN/intermittently;Available 24 hours/day Type of Home: House Home Access: Stairs to enter Entrance Stairs-Rails: None Entrance Stairs-Number of Steps: 2     Home Equipment: Agricultural consultant (2 wheels)      Prior  Function Prior Level of Function : Independent/Modified Independent             Mobility Comments: Working in Scientist, research (life sciences), 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 ADLs Comments: Ind     Extremity/Trunk Assessment   Upper Extremity Assessment Upper Extremity Assessment: Overall WFL for tasks assessed     Lower Extremity Assessment Lower Extremity Assessment: LLE deficits/detail LLE Deficits / Details: decreased sesnation, decreased  Pedal pulde, 3-/5 MMT LLE: Unable to fully assess due to pain LLE Sensation: decreased light touch;decreased proprioception LLE Coordination: WNL       Communication   Communication Communication: No apparent difficulties  Cognition Arousal: Alert Behavior During Therapy: WFL for tasks assessed/performed Overall Cognitive Status: Within Functional Limits for tasks assessed                                          General Comments      Exercises Other Exercises Other Exercises: Hip fracture exs provided fro every 3 hours 10 reps. in H/O.   Assessment/Plan    PT Assessment Patient needs continued PT services  PT Problem List Decreased strength;Decreased range of motion;Decreased activity tolerance;Decreased balance;Decreased mobility;Pain       PT Treatment Interventions Gait training;Stair training;Therapeutic activities;Therapeutic exercise;Neuromuscular re-education;Balance training;Patient/family education    PT Goals (Current goals can be found in the Care Plan section)  Acute Rehab PT Goals Patient Stated Goal: " I want to get better and return to being like before." PT Goal Formulation: With patient Time For Goal Achievement: 04/26/23 Potential to Achieve Goals: Good    Frequency 7X/week     Co-evaluation               AM-PAC PT "6 Clicks" Mobility  Outcome Measure Help needed turning from your back to your side while in a flat bed without using bedrails?: A Lot Help needed moving from lying on your back to sitting on the side of a flat bed without using bedrails?: A Little Help needed moving to and from a bed to a chair (including a wheelchair)?: A Little Help needed standing up from a chair using your arms (e.g., wheelchair or bedside chair)?: A Little Help needed to walk in hospital room?: A  Little Help needed climbing 3-5 steps with a railing? : A Little 6 Click Score: 17    End of Session Equipment Utilized During Treatment: Gait belt Activity Tolerance: Patient limited by pain Patient left: in chair;with call bell/phone within reach;with family/visitor present Nurse Communication: Mobility status PT Visit Diagnosis: Other abnormalities of gait and mobility (R26.89);Muscle weakness (generalized) (M62.81);Difficulty in walking, not elsewhere classified (R26.2);Pain Pain - Right/Left: Left Pain - part of body: Hip    Time: 6767-2094 PT Time Calculation (min) (ACUTE ONLY): 40 min   Charges:   PT Evaluation $PT Eval Low Complexity: 1 Low PT Treatments $Gait Training: 8-22 mins $Therapeutic Exercise: 8-22 mins PT General Charges $$ ACUTE PT VISIT: 1 Visit    Janet Berlin PT DPT 4:44 PM,04/12/23

## 2023-04-12 NOTE — Plan of Care (Signed)
  Problem: Education: Goal: Knowledge of General Education information will improve Description: Including pain rating scale, medication(s)/side effects and non-pharmacologic comfort measures Outcome: Progressing   Problem: Health Behavior/Discharge Planning: Goal: Ability to manage health-related needs will improve Outcome: Progressing   Problem: Clinical Measurements: Goal: Ability to maintain clinical measurements within normal limits will improve Outcome: Progressing   Problem: Activity: Goal: Risk for activity intolerance will decrease Outcome: Progressing   Problem: Safety: Goal: Ability to remain free from injury will improve Outcome: Progressing   Problem: Pain Management: Goal: General experience of comfort will improve Outcome: Not Progressing

## 2023-04-12 NOTE — Progress Notes (Signed)
Initial Nutrition Assessment  DOCUMENTATION CODES:   Not applicable  INTERVENTION:   Ensure Enlive po BID, each supplement provides 350 kcal and 20 grams of protein.   MVI po daily   NUTRITION DIAGNOSIS:   Increased nutrient needs related to hip fracture as evidenced by estimated needs.  GOAL:   Patient will meet greater than or equal to 90% of their needs  MONITOR:   PO intake, Supplement acceptance, Labs, Weight trends, Skin, I & O's  REASON FOR ASSESSMENT:   Consult Hip fracture protocol  ASSESSMENT:   47 y/o female with h/o anxiety, depression ADHD and asthma who is admitted with hip fracture.  RD working remotely.  RD unable to speak with pt today as pt is in the OR. Pt with increased estimated needs r/t hip fracture. RD will add supplements and MVI to help pt meet her estimated needs. Per chart, pt appears weight stable pta. RD will obtain nutrition related history and exam at follow up.   Medications reviewed and include: NaCl w/ 5% dextrose @40ml /hr  Labs reviewed: K 3.5 wnl  NUTRITION - FOCUSED PHYSICAL EXAM: Unable to perform at this time   Diet Order:   Diet Order             Diet NPO time specified  Diet effective midnight                  EDUCATION NEEDS:   Not appropriate for education at this time  Skin:  Skin Assessment: Reviewed RN Assessment (incision L hip)  Last BM:  10/25  Height:   Ht Readings from Last 1 Encounters:  04/11/23 5\' 3"  (1.6 m)    Weight:   Wt Readings from Last 1 Encounters:  04/11/23 76.7 kg    Ideal Body Weight:  52.2 kg  BMI:  Body mass index is 29.94 kg/m.  Estimated Nutritional Needs:   Kcal:  1700-1900kcal/day  Protein:  85-95g/day  Fluid:  1.6-1.8L/day  Betsey Holiday MS, RD, LDN Please refer to [facility] for RD and/or RD on-call/weekend/after hours pager

## 2023-04-12 NOTE — Transfer of Care (Signed)
Immediate Anesthesia Transfer of Care Note  Patient: Joanna Walker  Procedure(s) Performed: INTRAMEDULLARY (IM) NAIL INTERTROCHANTERIC (Left: Hip)  Patient Location: PACU  Anesthesia Type:Spinal  Level of Consciousness: awake, drowsy, and patient cooperative  Airway & Oxygen Therapy: Patient Spontanous Breathing  Post-op Assessment: Report given to RN and Post -op Vital signs reviewed and stable  Post vital signs: Reviewed and stable  Last Vitals:  Vitals Value Taken Time  BP 99/69 04/12/23 1253  Temp 36.3 C 04/12/23 1253  Pulse 91 04/12/23 1256  Resp 26 04/12/23 1256  SpO2 97 % 04/12/23 1256  Vitals shown include unfiled device data.  Last Pain:  Vitals:   04/12/23 0833  TempSrc:   PainSc: 4          Complications: No notable events documented.

## 2023-04-12 NOTE — Plan of Care (Signed)

## 2023-04-12 NOTE — Progress Notes (Signed)
1      PROGRESS NOTE    Joanna Walker  WNU:[id number] DOB: 05-14-1976 DOA: 04/11/2023 PCP: Pcp, No    Brief Narrative:   47 year old female with a known history of asthma, anxiety, depression, ADHD admitted for impending left hip fracture   10/26: Status post nail fixation of impending left hip fracture   Assessment & Plan:   Principal Problem:   Femur fracture (HCC) Active Problems:   Anxiety and depression   ADD (attention deficit disorder)   *Impending left hip fracture (HCC) Per chart review, patient first was seen on September 25 for left hip pain that have been occurring for approximately 3 months.  Then approximately 2 weeks ago, she had sudden exacerbation of pain.  MRI was obtained yesterday with results notable for impending/incomplete intertrochanteric left hip fracture.   -Status post nail fixation on 10/26 by Ortho - Tylenol, oxycodone and Dilaudid for pain control -PT and OT eval   ADD (attention deficit disorder) - Hold home Adderall and Adzenys while admitted   Anxiety and depression - Continue home clonazepam.     DVT prophylaxis: (Lovenox enoxaparin (LOVENOX) injection 40 mg Start: 04/13/23 0800 SCDs Start: 04/12/23 1343 Place TED hose Start: 04/12/23 1343 SCDs Start: 04/11/23 1907     Code Status: Full code Family Communication: Updated husband at bedside Disposition Plan: Possible discharge in next 2 to 3 days depending on clinical condition and postop recovery   Consultants:  Ortho  Procedures:  Nail fixation of impending left hip fracture  Antimicrobials:  IV Ancef for surgical prophylaxis   Subjective:  Patient is somewhat sleepy but wakes up to verbal commands.  Wanting to eat as she is hungry.  Husband at bedside.  Pain under control for now  Objective: Vitals:   04/12/23 1253 04/12/23 1302 04/12/23 1313 04/12/23 1315  BP: 99/69 117/69  104/72  Pulse: 94 95 86 84  Resp: 14 17 17 18   Temp: (!) 97.3 F (36.3 C)    97.6 F (36.4 C)  TempSrc:      SpO2: 97% 98% 98% 99%  Weight:      Height:        Intake/Output Summary (Last 24 hours) at 04/12/2023 1428 Last data filed at 04/12/2023 1244 Gross per 24 hour  Intake 400 ml  Output 75 ml  Net 325 ml   Filed Weights   04/11/23 1617  Weight: 76.7 kg    Examination:  General exam: Appears calm and comfortable  Respiratory system: Clear to auscultation. Respiratory effort normal. Cardiovascular system: S1 & S2 heard, RRR. No JVD, murmurs, rubs, gallops or clicks. No pedal edema. Gastrointestinal system: Abdomen is nondistended, soft and nontender. No organomegaly or masses felt. Normal bowel sounds heard. Central nervous system: Sleepy but wakes up easily no focal neurological deficits. Extremities: Sterile dressing in place at the surgical site Skin: No rashes, lesions or ulcers Psychiatry: Judgement and insight appear normal. Mood & affect appropriate.     Data Reviewed: I have personally reviewed following labs and imaging studies  CBC: Recent Labs  Lab 04/11/23 1805  WBC 7.3  NEUTROABS 4.4  HGB 13.5  HCT 39.1  MCV 86.5  PLT 292   Basic Metabolic Panel: Recent Labs  Lab 04/11/23 1805  NA 136  K 3.5  CL 105  CO2 22  GLUCOSE 104*  BUN 10  CREATININE 0.76  CALCIUM 8.9    Coagulation Profile: Recent Labs  Lab 04/11/23 1805  INR 1.1  Radiology Studies: DG C-Arm 1-60 Min-No Report  Result Date: 04/12/2023 Fluoroscopy was utilized by the requesting physician.  No radiographic interpretation.        Scheduled Meds:  acetaminophen  1,000 mg Oral Q6H   ARIPiprazole  5 mg Oral Daily   docusate sodium  100 mg Oral BID   [START ON 04/13/2023] enoxaparin (LOVENOX) injection  40 mg Subcutaneous Q24H   [START ON 04/13/2023] feeding supplement  237 mL Oral BID BM   FLUoxetine  40 mg Oral Daily   [START ON 04/13/2023] multivitamin with minerals  1 tablet Oral Daily   topiramate  50 mg Oral BID   traZODone   100 mg Oral QHS   Continuous Infusions:   ceFAZolin (ANCEF) IV     dextrose 5 % and 0.2 % NaCl     dextrose 5 % and 0.9 % NaCl       LOS: 1 day    Time spent: 35 minutes    Delfino Lovett, MD [facility] Pager [phone]  If 7PM-7AM, please contact night-coverage [email or web address] Password [email or web address] 04/12/2023, 2:28 PM

## 2023-04-12 NOTE — Op Note (Signed)
04/12/2023  12:54 PM  Patient:   Joanna Walker  Pre-Op Diagnosis:   Impending basicervical/intertrochanteric left hip fracture.  Post-Op Diagnosis:   Same  Procedure:   Reduction and internal fixation of impending left hip fracture with Biomet Affixis TFN nail.  Surgeon:   Maryagnes Amos, MD  Assistant:   None  Anesthesia:   Spinal  Findings:   As above  Complications:   None  EBL:   75 cc  Fluids:   500 cc crystalloid  UOP:   None  TT:   None  Drains:   None  Closure:   Staples  Implants:   Biomet Affixis 9 x 180 mm TFN with a 90 mm lag screw and a 38 mm distal interlocking screw  Brief Clinical Note:   The patient is a 47 year old female with a 4-5 month history of gradually worsening left hip/groin pain. Her symptoms have progressed despite medications, activity modification, etc. Initial plain radiographs were unremarkable but subsequent MRI scanning is confirmed the presence of an impending stress fracture of the left femoral neck region. The patient has been cleared medically and presents at this time for reduction and internal fixation of the impending left hip fracture.  Procedure:   The patient was brought into the operating room. After adequate spinal anesthesia was obtained, the patient was lain in the supine position on the fracture table. The uninjured leg was placed in a flexed and abducted position while the injured lower extremity was placed in longitudinal traction. The fracture was reduced using longitudinal traction and internal rotation. The adequacy of reduction was verified fluoroscopically in AP and lateral projections and found to be near anatomic. The lateral aspects of the left hip and thigh were prepped with ChloraPrep solution before being draped sterilely. Preoperative antibiotics were administered. A timeout was performed to verify the appropriate surgical site.   The greater trochanter was identified fluoroscopically and an  approximately 5-6 cm incision made about 2-3 fingerbreadths above the tip of the greater trochanter. The incision was carried down through the subcutaneous tissues to expose the gluteal fascia. This was split the length of the incision, providing access to the tip of the trochanter. Under fluoroscopic guidance, a guidewire was drilled through the tip of the trochanter into the proximal metaphysis to the level of the lesser trochanter. After verifying its position fluoroscopically in AP and lateral projections, it was overreamed with the initial reamer to the depth of the lesser trochanter. A guidewire was passed down through the femoral canal to the supracondylar region. The adequacy of guidewire position was verified fluoroscopically in AP and lateral projections.   Given the fact the fracture was incomplete and she is young and with apparently good bone stock, it was felt best to proceed with a short intramedullary nail. The guidewire was overreamed sequentially using the flexible reamers, beginning with a 9 mm reamer and progressing to a 10.5 mm reamer. This provided good cortical chatter. The 9 x 180 mm Biomet Affixis TFN rod was selected and advanced to the appropriate depth, as verified fluoroscopically.   The guide system for the lag screw was positioned and advanced through an approximately 2 cm stab incision over the lateral aspect of the proximal femur. The guidewire was drilled up through the trochanteric femoral nail and into the femoral neck to rest within 5 mm of subchondral bone. After verifying its position in the femoral neck and head in both AP and lateral projections, the guidewire was measured and found  to be optimally replicated by a 90 mm lag screw. The guidewire was overreamed to the appropriate depth before the hole was tapped, given the quality of her bone. The lag screw was inserted and advanced to the appropriate depth as verified fluoroscopically in AP and lateral projections. The  locking screw was advanced, then backed off a quarter turn to set the lag screw. Again the adequacy of hardware position and fracture reduction was verified fluoroscopically in AP and lateral projections and found to be excellent.  Attention was directed distally. Using the same guide system, an approximately 1.0 cm stab incision was made over the skin at the appropriate point before the drill bit was advanced through the cortex and across the static hole of the nail. The appropriate length of the screw was determined before the 38 mm distal interlocking screw was positioned, then advanced and tightened securely. Again the adequacy of screw position was verified fluoroscopically in AP and lateral projections and found to be excellent.  The wounds were irrigated thoroughly with sterile saline solution before the abductor fascia was reapproximated using #1 Vicryl interrupted sutures. The subcutaneous tissues were closed using 2-0 Vicryl interrupted sutures. The skin was closed using staples. A total of 20 cc of 0.5% Sensorcaine with epinephrine plus 10 cc of Exparel was injected in and around all incisions. Sterile occlusive dressings were applied to all wounds before the patient was awakened and returned to the recovery room in satisfactory condition after tolerating the procedure well.

## 2023-04-13 DIAGNOSIS — F32A Depression, unspecified: Secondary | ICD-10-CM | POA: Diagnosis not present

## 2023-04-13 DIAGNOSIS — F419 Anxiety disorder, unspecified: Secondary | ICD-10-CM | POA: Diagnosis not present

## 2023-04-13 DIAGNOSIS — F909 Attention-deficit hyperactivity disorder, unspecified type: Secondary | ICD-10-CM | POA: Diagnosis not present

## 2023-04-13 DIAGNOSIS — S72145A Nondisplaced intertrochanteric fracture of left femur, initial encounter for closed fracture: Secondary | ICD-10-CM | POA: Diagnosis not present

## 2023-04-13 MED ORDER — ASPIRIN 325 MG PO TBEC: 325.0000 mg | DELAYED_RELEASE_TABLET | Freq: Every day | ORAL | Status: AC

## 2023-04-13 MED ORDER — HYDROCODONE-ACETAMINOPHEN 5-325 MG PO TABS: 1.0000 | ORAL_TABLET | Freq: Four times a day (QID) | ORAL | 0 refills | Status: DC | PRN

## 2023-04-13 MED ORDER — ONDANSETRON 4 MG PO TBDP
4.0000 mg | ORAL_TABLET | Freq: Three times a day (TID) | ORAL | 0 refills | Status: AC | PRN
Start: 2023-04-13 — End: 2023-04-20

## 2023-04-13 NOTE — Discharge Summary (Signed)
Physician Discharge Summary   Patient: Joanna Walker MRN: [id number] DOB: 1976-05-19  Admit date:     04/11/2023  Discharge date: 04/13/23  Discharge Physician: Delfino Lovett   PCP: Center, [facility]   Recommendations at discharge:   Follow-up with outpatient providers as requested  Discharge Diagnoses: Principal Problem:   Femur fracture (HCC) Active Problems:   Anxiety and depression   ADD (attention deficit disorder)  Hospital Course: Assessment and Plan:  *Impending left hip fracture (HCC) -Status post nail fixation on 10/26 by Ortho - Evaluated by PT while in the hospital.  Recommends outpatient PT -Weightbearing as tolerated Dressing instruction and pain management per Ortho -2 weeks follow-up with [facility] orthopedics  ADD (attention deficit disorder) Anxiety and depression - Continue home clonazepam.         Consultants: Orthopedics Procedures performed: status post nail fixation on 10/26 by Ortho  Disposition: Home Diet recommendation:  Discharge Diet Orders (From admission, onward)     Start     Ordered   04/13/23 0000  Diet - low sodium heart healthy        04/13/23 1153           Carb modified diet DISCHARGE MEDICATION: Allergies as of 04/13/2023       Reactions   Morphine Nausea And Vomiting        Medication List     TAKE these medications    ARIPiprazole 5 MG tablet Commonly known as: ABILIFY Take by mouth.   ascorbic acid 500 MG tablet Commonly known as: VITAMIN C Take 500 mg by mouth 2 (two) times daily.   aspirin EC 325 MG tablet Take 1 tablet (325 mg total) by mouth daily.   clonazePAM 1 MG tablet Commonly known as: KLONOPIN SMARTSIG:1 Tablet(s) By Mouth Every 12 Hours PRN   Fish Oil 1000 MG Caps Take 1 capsule by mouth 2 (two) times daily.   FLUoxetine 40 MG capsule Commonly known as: PROZAC Take 40 mg by mouth daily.   fluticasone 50 MCG/ACT nasal spray Commonly known as: FLONASE TWO  PUFFS IN EACH NOSTRIL ONCE A DAY   HYDROcodone-acetaminophen 5-325 MG tablet Commonly known as: Norco Take 1 tablet by mouth every 6 (six) hours as needed for moderate pain (pain score 4-6).   MULTIVITAMIN PO Take 1 tablet by mouth daily.   neomycin-polymyxin-hydrocortisone OTIC solution Commonly known as: CORTISPORIN Place 3 drops into the right ear 4 (four) times daily. For 7 days   norethindrone-ethinyl estradiol 1-20 MG-MCG tablet Commonly known as: LOESTRIN Take 1 tablet by mouth daily.   omeprazole 20 MG capsule Commonly known as: PRILOSEC Take 20 mg by mouth daily.   ondansetron 4 MG disintegrating tablet Commonly known as: ZOFRAN-ODT Take 1 tablet (4 mg total) by mouth every 8 (eight) hours as needed for up to 7 days for nausea or vomiting.   polyethylene glycol powder 17 GM/SCOOP powder Commonly known as: GLYCOLAX/MIRALAX Take by mouth.   topiramate 50 MG tablet Commonly known as: TOPAMAX Take 50 mg by mouth 2 (two) times daily.   traZODone 100 MG tablet Commonly known as: Merchant [employer] officer  (From admission, onward)           Start     Ordered   04/13/23 1113  For home use only DME Walker rolling  Once       Question Answer Comment  Walker: With 5 Inch  Wheels   Patient needs a walker to treat with the following condition Post-op pain      04/13/23 1112            Follow-up Information     Poggi, Excell Seltzer, MD. Schedule an appointment as soon as possible for a visit in 2 week(s).   Specialty: Orthopedic Surgery Why: [facility] Discharge F/UP Contact information: [address] [facility] [place] [address] [phone]         Center, [facility]. Schedule an appointment as soon as possible for a visit in 1 week(s).   Specialty: General Practice Why: [facility] Discharge F/UP Contact information: [address] [place] [address] [phone]                 Discharge Exam: Ceasar Mons Weights   04/11/23 1617  Weight: 76.7 kg   General exam: Appears calm and comfortable  Respiratory system: Clear to auscultation. Respiratory effort normal. Cardiovascular system: S1 & S2 heard, RRR. No JVD, murmurs, rubs, gallops or clicks. No pedal edema. Gastrointestinal system: Abdomen is soft, benign Central nervous system: Awake and alert, nonfocal Extremities: Sterile dressing in place at the surgical site Skin: No rashes, lesions or ulcers Psychiatry: Judgement and insight appear normal. Mood & affect appropriate.   Condition at discharge: good  The results of significant diagnostics from this hospitalization (including imaging, microbiology, ancillary and laboratory) are listed below for reference.   Imaging Studies: DG HIP UNILAT WITH PELVIS 2-3 VIEWS LEFT  Result Date: 04/12/2023 CLINICAL DATA:  [id number] Surgery, elective [id number] EXAM: DG HIP (WITH OR WITHOUT PELVIS) 2-3V LEFT COMPARISON:  June 15, 2022 FINDINGS: Spot fluoroscopy images were obtained for surgical planning purposes. Status post LEFT femoral intramedullary rod fixation. LEFT femoral neck fracture is in anatomic alignment. Time: 1 minute 7 seconds Dose: 13.04 mGy Please reference procedure report for further details. IMPRESSION: Status post LEFT femoral intramedullary rod fixation. Electronically Signed   By: Meda Klinefelter M.D.   On: 04/12/2023 14:58   DG C-Arm 1-60 Min-No Report  Result Date: 04/12/2023 Fluoroscopy was utilized by the requesting physician.  No radiographic interpretation.    Microbiology: Results for orders placed or performed during the hospital encounter of 10/22/21  Urine Culture     Status: Abnormal   Collection Time: 10/22/21  9:24 PM   Specimen: Urine, Clean Catch  Result Value Ref Range Status   Specimen Description   Final    URINE, CLEAN CATCH Performed at [facility], [address], [place], [place] [address]    Special  Requests   Final    NONE Performed at [facility], [address] [place], [place] [address]    Culture MULTIPLE SPECIES PRESENT, SUGGEST RECOLLECTION (A)  Final   Report Status 10/24/2021 FINAL  Final    Labs: CBC: Recent Labs  Lab 04/11/23 1805  WBC 7.3  NEUTROABS 4.4  HGB 13.5  HCT 39.1  MCV 86.5  PLT 292   Basic Metabolic Panel: Recent Labs  Lab 04/11/23 1805  NA 136  K 3.5  CL 105  CO2 22  GLUCOSE 104*  BUN 10  CREATININE 0.76  CALCIUM 8.9   Liver Function Tests: No results for input(s): "AST", "ALT", "ALKPHOS", "BILITOT", "PROT", "ALBUMIN" in the last 168 hours. CBG: No results for input(s): "GLUCAP" in the last 168 hours.  Discharge time spent: greater than 30 minutes.  Signed: Delfino Lovett, MD [facility] 04/13/2023

## 2023-04-13 NOTE — TOC Transition Note (Signed)
Transition of Care [facility]) - CM/SW Discharge Note   Patient Details  Name: Joanna Walker MRN: [id number] Date of Birth: April 30, 1976  [facility] CM/SW Contact:  Kemper Durie, RN Phone Number: 04/13/2023, 10:35 AM   Clinical Narrative:     Patient admitted from home, lives with her boyfriend.  Report PCP is Dr. Richarda Blade with the [facility] but in the process of transitioning over to [facility], obtains medications from [facility].  Aware of recommendations for HHPT with plan to transition to outpatient PT as well as recommendations for rolling walker and 3in1/BSC.  She agrees to both, does not have preference for either.  Case discussed with [facility] agencies for referral, notified that patient's copay will be around $100+ per visit for HHPT.  Patient isn't able to pay out of pocket.  MD agrees that patient can start directly with outpatient, patient agrees.  Discussed different sites for therapy, she state her insurance will not pay for [facility] facilities, and she will call her PCP tomorrow morning to have order sent to [facility].    Spoke with Selena Batten at [facility] regarding DME, notified they are out of network.  Patient is able to pay out of pocket for DME ($60 for RW and $70 for [facility]). Spoke with Vaughan Basta at [facility], notified they are unable to service patient today due to staffing.  This RNCM inquired about having DME delivered to the home, he stated he was not able to guarantee this could be done as the request would have to go through a different office to see if patient was eligible, and to see if her insurance would cover.  Patient updated, state she is using a walker from family currently and will ask family if they have other equipment she could use.  Also advised patient to look at second hand DME stores and local pharmacies to see if they have additional equipment she could use (such as raised toilet seat).  She verbalized understanding, planning for discharge home today.    Final  next level of care: OP Rehab Barriers to Discharge: Barriers Resolved   Patient Goals and CMS Choice      Discharge Placement                         Discharge Plan and Services Additional resources added to the After Visit Summary for                                       Social Determinants of Health (SDOH) Interventions SDOH Screenings   Food Insecurity: No Food Insecurity (04/11/2023)  Housing: Low Risk  (04/11/2023)  Transportation Needs: No Transportation Needs (04/11/2023)  Utilities: Not At Risk (04/11/2023)  Depression (PHQ2-9): Low Risk  (10/05/2020)  Recent Concern: Depression (PHQ2-9) - Medium Risk (09/07/2020)  Financial Resource Strain: Low Risk  (04/05/2023)   Received from [facility]  Tobacco Use: Low Risk  (04/11/2023)  Recent Concern: Tobacco Use - Medium Risk (04/03/2023)   Received from [facility]     Readmission Risk Interventions     No data to display

## 2023-04-13 NOTE — Anesthesia Postprocedure Evaluation (Signed)
Anesthesia Post Note  Patient: Joanna Walker  Procedure(s) Performed: INTRAMEDULLARY (IM) NAIL INTERTROCHANTERIC (Left: Hip)  Patient location during evaluation: Nursing Unit Anesthesia Type: Spinal Level of consciousness: oriented and awake and alert Pain management: pain level controlled Vital Signs Assessment: post-procedure vital signs reviewed and stable Respiratory status: spontaneous breathing and respiratory function stable Cardiovascular status: blood pressure returned to baseline and stable Postop Assessment: no headache, no backache, no apparent nausea or vomiting and patient able to bend at knees Anesthetic complications: no   No notable events documented.   Last Vitals:  Vitals:   04/13/23 0424 04/13/23 0835  BP: 113/68 107/74  Pulse: 92 88  Resp: 16 18  Temp: 36.7 C 36.9 C  SpO2: 95% 96%    Last Pain:  Vitals:   04/13/23 0458  TempSrc:   PainSc: 6                  Cleda Mccreedy Raysean Graumann

## 2023-04-13 NOTE — Progress Notes (Signed)
Subjective: 1 Day Post-Op Procedure(s) (LRB): INTRAMEDULLARY (IM) NAIL INTERTROCHANTERIC (Left) Patient reports pain as mild.   Patient seen in rounds with Dr. Joice Lofts. Patient is well, and has had no acute complaints or problems We will start therapy today.  Plan is to go Home after hospital stay.  Objective: Vital signs in last 24 hours: Temp:  [97.3 F (36.3 C)-98.4 F (36.9 C)] 98.4 F (36.9 C) (10/27 0835) Pulse Rate:  [83-95] 88 (10/27 0835) Resp:  [14-18] 18 (10/27 0835) BP: (99-117)/(68-76) 107/74 (10/27 0835) SpO2:  [95 %-99 %] 96 % (10/27 0835)  Intake/Output from previous day:  Intake/Output Summary (Last 24 hours) at 04/13/2023 1109 Last data filed at 04/13/2023 0607 Gross per 24 hour  Intake 960 ml  Output 75 ml  Net 885 ml    Intake/Output this shift: No intake/output data recorded.  Labs: Recent Labs    04/11/23 1805  HGB 13.5   Recent Labs    04/11/23 1805  WBC 7.3  RBC 4.52  HCT 39.1  PLT 292   Recent Labs    04/11/23 1805  NA 136  K 3.5  CL 105  CO2 22  BUN 10  CREATININE 0.76  GLUCOSE 104*  CALCIUM 8.9   Recent Labs    04/11/23 1805  INR 1.1    EXAM General - Patient is Alert, Appropriate, and Oriented Extremity - Neurologically intact ABD soft Neurovascular intact Sensation intact distally Intact pulses distally Dorsiflexion/Plantar flexion intact No cellulitis present Compartment soft Dressing - dressing C/D/I and no drainage Motor Function - intact, moving foot and toes well on exam. Able to plantar and dorsi flex with good strength and ROM. Neurovascularly intact to all dermatomes down left lower extremity. Pedal pulses apprecaited   Past Medical History:  Diagnosis Date   ADD (attention deficit disorder)    Allergy    Anxiety    Asthma    Depression     Assessment/Plan: 1 Day Post-Op Procedure(s) (LRB): INTRAMEDULLARY (IM) NAIL INTERTROCHANTERIC (Left) Principal Problem:   Femur fracture (HCC) Active  Problems:   Anxiety and depression   ADD (attention deficit disorder)  Estimated body mass index is 29.94 kg/m as calculated from the following:   Height as of this encounter: 5\' 3"  (1.6 m).   Weight as of this encounter: 76.7 kg.  Patient has passed her PT protocols, look to transition to outpt PT with DC  WBAT  Discussed with the patient continuing to utilize ice over the bandage  Patient will wear TED hose bilaterally to help prevent DVT and clot formation  Discussed the honeycomb bandage and wound care  Discussed sending the patient home with Norco for as needed pain management.  Patient will take an 325 mg aspirin once daily for DVT prophylaxis  JP drain removed without difficulty, intact  Weight-Bearing as tolerated to left leg  Patient will follow-up with [facility] clinic orthopedics in 2 weeks for staple removal, re-imaging and reevaluation   Rayburn Go, PA-C [facility] 04/13/2023, 11:09 AM \

## 2023-04-13 NOTE — Progress Notes (Addendum)
I have read and reviewed the following documentation and am in agreement with the information.    Aleda Grana, PT, DPT 11:41 AM,04/13/23     Physical Therapy Treatment Patient Details Name: Joanna Walker MRN: [id number] DOB: 1975-10-23 Today's Date: 04/13/2023   History of Present Illness 47 year old female with a known history of asthma, anxiety, depression, ADHD admitted for impending left hip fracture      10/26: Status post nail fixation of impending left hip fracture. WBAT to LLE    PT Comments  OOB and completes stair training and gait on unit with RW and  cga/supervision.  No further questions or concerns.  Returned later to discuss barriers to discharge as DME company not in network.  She would like RW but no to [facility] and is ok to transition to OPPT.  Will adjust DC recommendations to reflect.  Discussed with TOC and MD. Educated on ways to make transition to commode easier without raised toilet seat. She has no hip precautions at this time so height is more for comfort.   If plan is discharge home, recommend the following: A little help with walking and/or transfers;A little help with bathing/dressing/bathroom;Assistance with cooking/housework;Assist for transportation;Help with stairs or ramp for entrance   Can travel by private vehicle        Equipment Recommendations  BSC/3in1    Recommendations for Other Services       Precautions / Restrictions Precautions Precautions: Fall Restrictions Weight Bearing Restrictions: Yes LLE Weight Bearing: Weight bearing as tolerated     Mobility  Bed Mobility Overal bed mobility: Needs Assistance Bed Mobility: Supine to Sit, Sit to Supine     Supine to sit: Contact guard Sit to supine: Contact guard assist     Patient Response: Cooperative  Transfers Overall transfer level: Needs assistance Equipment used: Rolling walker (2 wheels) Transfers: Sit to/from Stand Sit to Stand: Contact guard assist                 Ambulation/Gait Ambulation/Gait assistance: Contact guard assist Gait Distance (Feet): 400 Feet Assistive device: Rolling walker (2 wheels) Gait Pattern/deviations: Step-to pattern Gait velocity: DEc         Stairs Stairs: Yes Stairs assistance: Supervision, Contact guard assist Stair Management: Step to pattern, Two rails Number of Stairs: 4 General stair comments: has one step in she plans to use door frame for support, reviewed curb step for her other step which she feels good as she was doing prior to surgery   Wheelchair Mobility     Tilt Bed Tilt Bed Patient Response: Cooperative  Modified Rankin (Stroke Patients Only)       Balance Overall balance assessment: Needs assistance Sitting-balance support: Bilateral upper extremity supported Sitting balance-Leahy Scale: Good     Standing balance support: Bilateral upper extremity supported Standing balance-Leahy Scale: Good Standing balance comment: No LOB noted.                            Cognition Arousal: Alert Behavior During Therapy: WFL for tasks assessed/performed Overall Cognitive Status: Within Functional Limits for tasks assessed                                          Exercises      General Comments        Pertinent Vitals/Pain Pain Assessment Pain Assessment:  Faces Faces Pain Scale: Hurts a little bit Pain Location: L hip Pain Descriptors / Indicators: Aching Pain Intervention(s): Limited activity within patient's tolerance, Monitored during session, Repositioned    Home Living                          Prior Function            PT Goals (current goals can now be found in the care plan section) Progress towards PT goals: Progressing toward goals    Frequency    7X/week      PT Plan      Co-evaluation              AM-PAC PT "6 Clicks" Mobility   Outcome Measure  Help needed turning from your back to your  side while in a flat bed without using bedrails?: None Help needed moving from lying on your back to sitting on the side of a flat bed without using bedrails?: None Help needed moving to and from a bed to a chair (including a wheelchair)?: None Help needed standing up from a chair using your arms (e.g., wheelchair or bedside chair)?: None Help needed to walk in hospital room?: None Help needed climbing 3-5 steps with a railing? : A Little 6 Click Score: 23    End of Session Equipment Utilized During Treatment: Gait belt Activity Tolerance: Patient limited by pain Patient left: with call bell/phone within reach;with family/visitor present;in bed Nurse Communication: Mobility status PT Visit Diagnosis: Other abnormalities of gait and mobility (R26.89);Muscle weakness (generalized) (M62.81);Difficulty in walking, not elsewhere classified (R26.2);Pain Pain - Right/Left: Left Pain - part of body: Hip     Time: 1478-2956 PT Time Calculation (min) (ACUTE ONLY): 13 min  Charges:    $Gait Training: 8-22 mins PT General Charges $$ ACUTE PT VISIT: 1 Visit                   Danielle Dess, PTA 04/13/23, 10:19 AM

## 2023-04-14 ENCOUNTER — Encounter: Payer: Self-pay | Admitting: Surgery

## 2023-04-16 LAB — SURGICAL PATHOLOGY

## 2023-04-30 ENCOUNTER — Ambulatory Visit: Payer: BLUE CROSS/BLUE SHIELD

## 2023-06-30 ENCOUNTER — Telehealth: Payer: Self-pay

## 2023-06-30 DIAGNOSIS — Z3041 Encounter for surveillance of contraceptive pills: Secondary | ICD-10-CM

## 2023-06-30 NOTE — Telephone Encounter (Signed)
 My chart message sent on previous 05/19/23 annual appointment needed notification: Hi Joanna Walker,   This is a friendly reminder to contact our office to schedule your annual appointment. We received a refill request from [facility] for your norethindrone -ethinyl estradiol (LOESTRIN) 1-20 MG-MCG tablet . This refill can not be authorized until your appointment is scheduled.

## 2023-07-01 NOTE — Telephone Encounter (Signed)
 Last read by Burr Medico at  6:38 PM on 06/30/2023.

## 2023-07-02 ENCOUNTER — Other Ambulatory Visit: Payer: Self-pay | Admitting: Obstetrics and Gynecology

## 2023-07-02 DIAGNOSIS — Z3041 Encounter for surveillance of contraceptive pills: Secondary | ICD-10-CM

## 2023-07-02 MED ORDER — NORETHINDRONE ACET-ETHINYL EST 1-20 MG-MCG PO TABS: 1.0000 | ORAL_TABLET | Freq: Every day | ORAL | 0 refills | Status: DC

## 2023-07-02 NOTE — Telephone Encounter (Signed)
 Pt has annual scheduled. Requesting RF sent to local [facility] pharmacy. Pt aware Rx sent.

## 2023-07-03 ENCOUNTER — Other Ambulatory Visit: Payer: Self-pay | Admitting: Family Medicine

## 2023-07-03 DIAGNOSIS — Z1231 Encounter for screening mammogram for malignant neoplasm of breast: Secondary | ICD-10-CM

## 2023-07-23 ENCOUNTER — Ambulatory Visit: Payer: Managed Care, Other (non HMO) | Admitting: Obstetrics and Gynecology

## 2023-07-23 DIAGNOSIS — Z3041 Encounter for surveillance of contraceptive pills: Secondary | ICD-10-CM

## 2023-07-23 DIAGNOSIS — Z01419 Encounter for gynecological examination (general) (routine) without abnormal findings: Secondary | ICD-10-CM

## 2023-07-30 ENCOUNTER — Ambulatory Visit: Payer: Managed Care, Other (non HMO) | Admitting: Obstetrics and Gynecology

## 2023-07-30 DIAGNOSIS — Z01419 Encounter for gynecological examination (general) (routine) without abnormal findings: Secondary | ICD-10-CM

## 2023-07-30 DIAGNOSIS — Z124 Encounter for screening for malignant neoplasm of cervix: Secondary | ICD-10-CM

## 2023-07-30 DIAGNOSIS — Z3041 Encounter for surveillance of contraceptive pills: Secondary | ICD-10-CM

## 2023-08-11 ENCOUNTER — Telehealth: Payer: Managed Care, Other (non HMO)

## 2023-08-11 DIAGNOSIS — J019 Acute sinusitis, unspecified: Secondary | ICD-10-CM

## 2023-08-11 DIAGNOSIS — B9789 Other viral agents as the cause of diseases classified elsewhere: Secondary | ICD-10-CM | POA: Diagnosis not present

## 2023-08-12 MED ORDER — PREDNISONE 10 MG (21) PO TBPK: ORAL_TABLET | ORAL | 0 refills | Status: DC

## 2023-08-12 NOTE — Progress Notes (Signed)
 I have spent 5 minutes in review of e-visit questionnaire, review and updating patient chart, medical decision making and response to patient.   Piedad Climes, PA-C

## 2023-08-12 NOTE — Progress Notes (Signed)
 E-Visit for Sinus Problems  We are sorry that you are not feeling well.  Here is how we plan to help!  Based on what you have shared with me it looks like you have sinusitis.  Sinusitis is inflammation and infection in the sinus cavities of the head.  Based on your presentation I believe you most likely have Acute Viral Sinusitis.This is an infection most likely caused by a virus. There is not specific treatment for viral sinusitis other than to help you with the symptoms until the infection runs its course.  You may use an oral decongestant such as Mucinex D or if you have glaucoma or high blood pressure use plain Mucinex. Saline nasal spray help and can safely be used as often as needed for congestion, I have prescribed: a prednisone tablet pack to take as directed to reduce inflammation and speed recovery.  Some authorities believe that zinc sprays or the use of Echinacea may shorten the course of your symptoms.  Sinus infections are not as easily transmitted as other respiratory infection, however we still recommend that you avoid close contact with loved ones, especially the very young and elderly.  Remember to wash your hands thoroughly throughout the day as this is the number one way to prevent the spread of infection!  Home Care: Only take medications as instructed by your medical team. Do not take these medications with alcohol. A steam or ultrasonic humidifier can help congestion.  You can place a towel over your head and breathe in the steam from hot water coming from a faucet. Avoid close contacts especially the very young and the elderly. Cover your mouth when you cough or sneeze. Always remember to wash your hands.  Get Help Right Away If: You develop worsening fever or sinus pain. You develop a severe head ache or visual changes. Your symptoms persist after you have completed your treatment plan.  Make sure you Understand these instructions. Will watch your condition. Will  get help right away if you are not doing well or get worse.   Thank you for choosing an e-visit.  Your e-visit answers were reviewed by a board certified advanced clinical practitioner to complete your personal care plan. Depending upon the condition, your plan could have included both over the counter or prescription medications.  Please review your pharmacy choice. Make sure the pharmacy is open so you can pick up prescription now. If there is a problem, you may contact your provider through [employer] and have the prescription routed to another pharmacy.  Your safety is important to [place]. If you have drug allergies check your prescription carefully.   For the next 24 hours you can use MyChart to ask questions about today's visit, request a non-urgent call back, or ask for a work or school excuse. You will get an email in the next two days asking about your experience. I hope that your e-visit has been valuable and will speed your recovery.

## 2023-08-20 ENCOUNTER — Ambulatory Visit: Payer: Managed Care, Other (non HMO) | Admitting: Obstetrics and Gynecology

## 2023-08-20 DIAGNOSIS — Z3041 Encounter for surveillance of contraceptive pills: Secondary | ICD-10-CM

## 2023-08-20 DIAGNOSIS — Z01419 Encounter for gynecological examination (general) (routine) without abnormal findings: Secondary | ICD-10-CM

## 2023-09-04 ENCOUNTER — Ambulatory Visit: Admitting: Obstetrics and Gynecology

## 2023-09-04 DIAGNOSIS — Z01419 Encounter for gynecological examination (general) (routine) without abnormal findings: Secondary | ICD-10-CM

## 2023-09-04 DIAGNOSIS — Z3041 Encounter for surveillance of contraceptive pills: Secondary | ICD-10-CM

## 2023-09-04 DIAGNOSIS — Z124 Encounter for screening for malignant neoplasm of cervix: Secondary | ICD-10-CM

## 2023-09-09 ENCOUNTER — Encounter

## 2023-09-13 IMAGING — US US EXTREM LOW VENOUS*R*
1 series · 14 of 24 positions shown · non-contrast
Comparison: None.

CLINICAL DATA: Leg swelling

EXAM:
RIGHT LOWER EXTREMITY VENOUS DOPPLER ULTRASOUND
TECHNIQUE: Gray-scale sonography with compression, as well as color and duplex
ultrasound, were performed to evaluate the deep venous system(s)
from the level of the common femoral vein through the popliteal and
proximal calf veins.

[Series 1: us venous img lower uni right (dvt) · portal-venous · 14 of 59 slices shown]
[im 1/59]
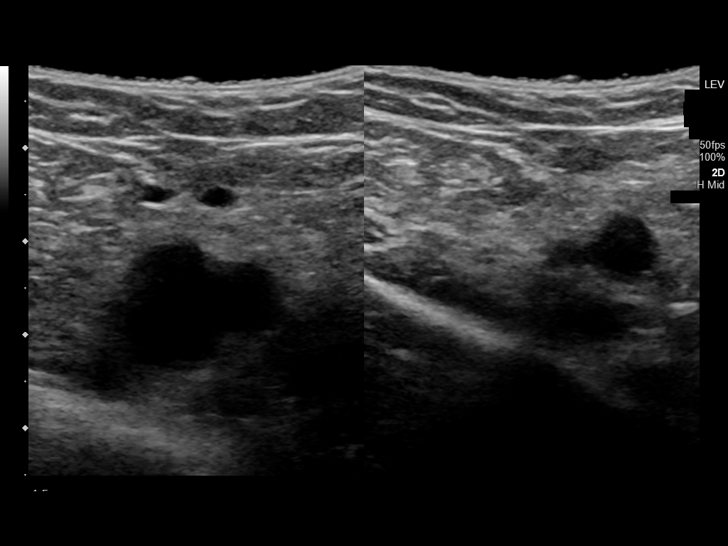
[im 6/59]
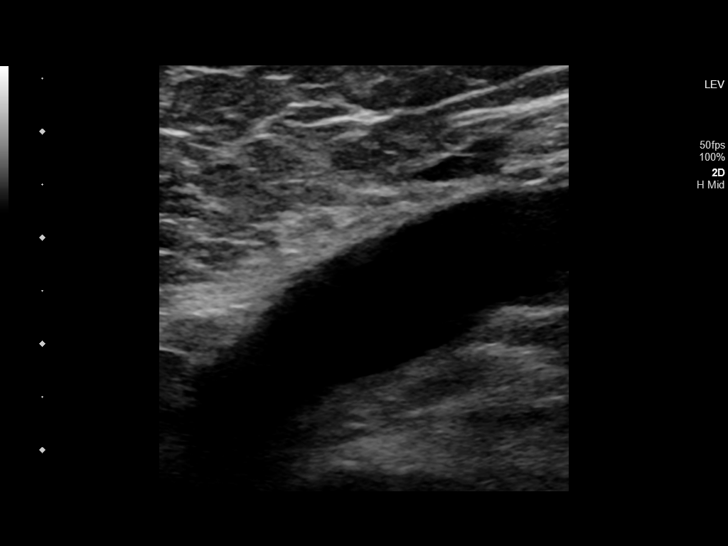
[im 11/59]
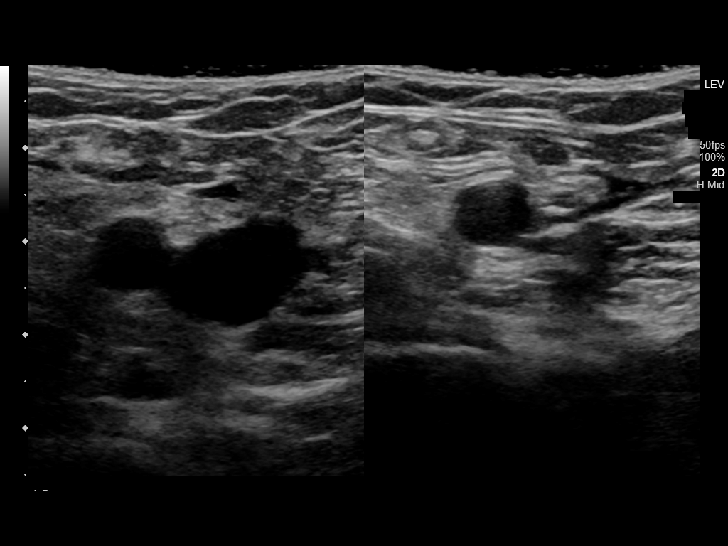
[im 16/59]
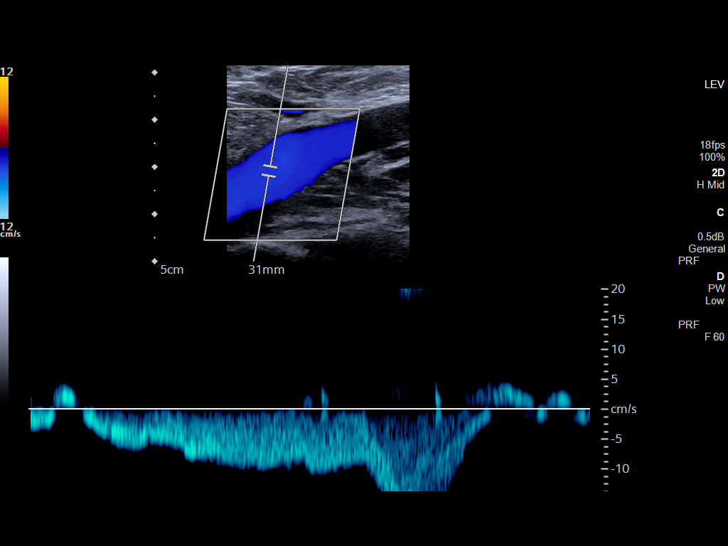
[im 18/59]
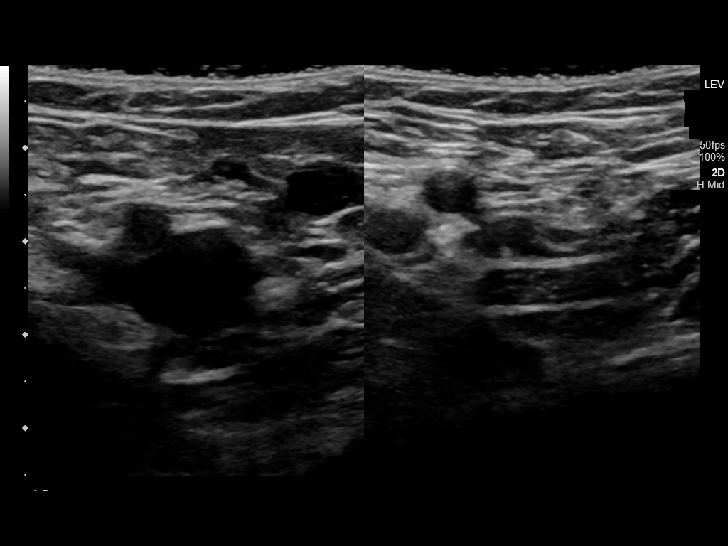
[im 23/59]
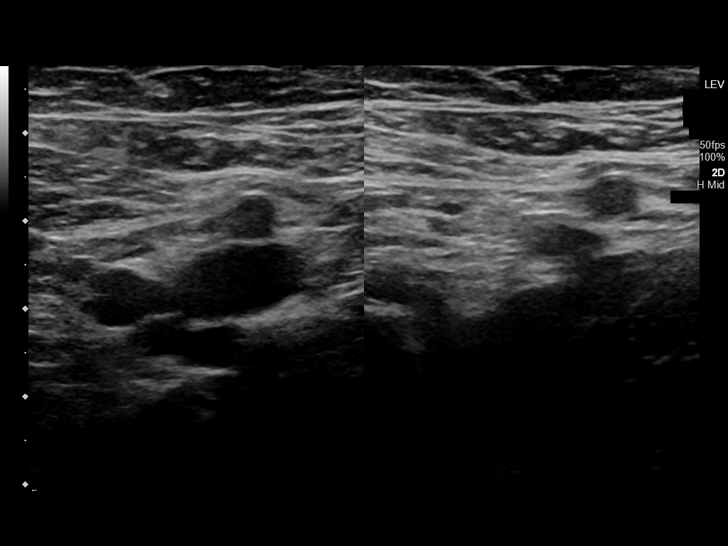
[im 28/59]
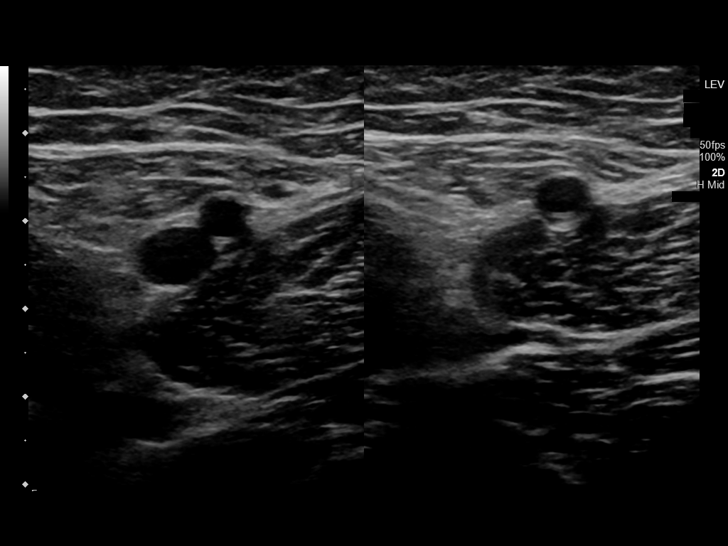
[im 31/59]
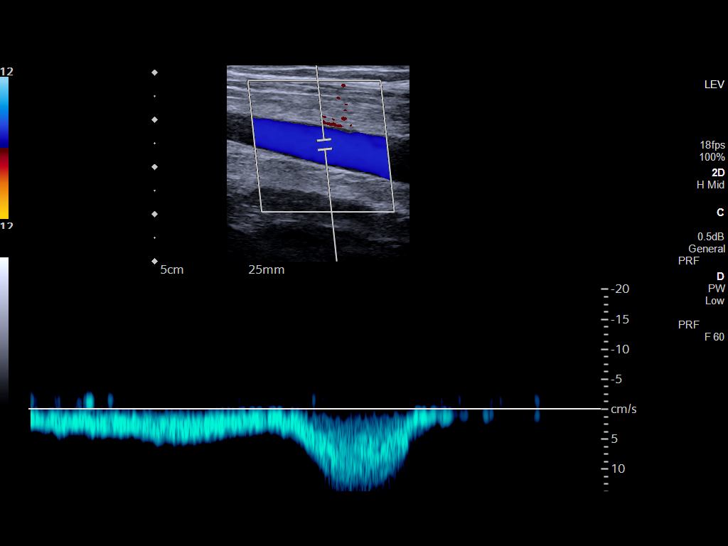
[im 36/59]
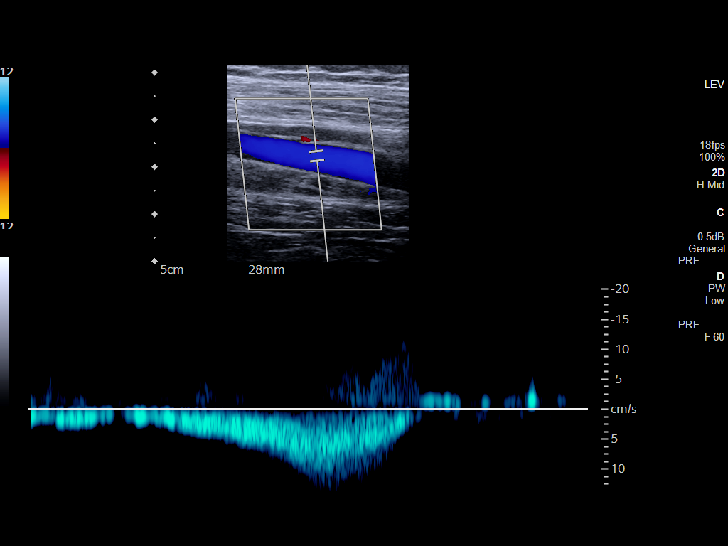
[im 41/59]
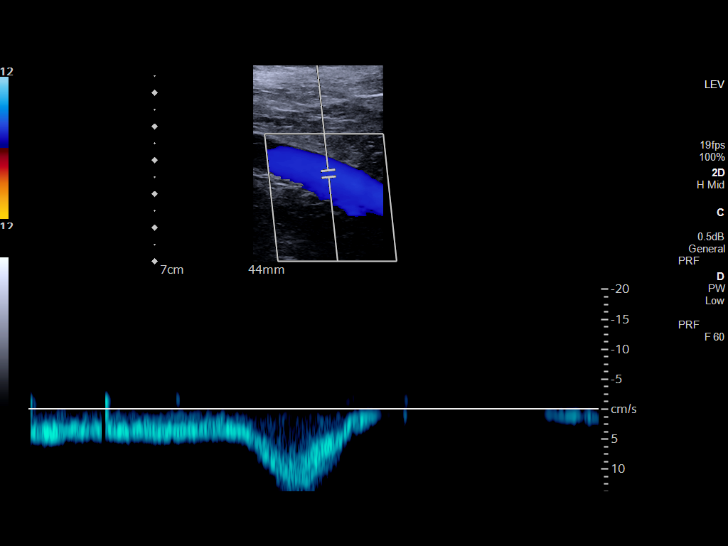
[im 46/59]
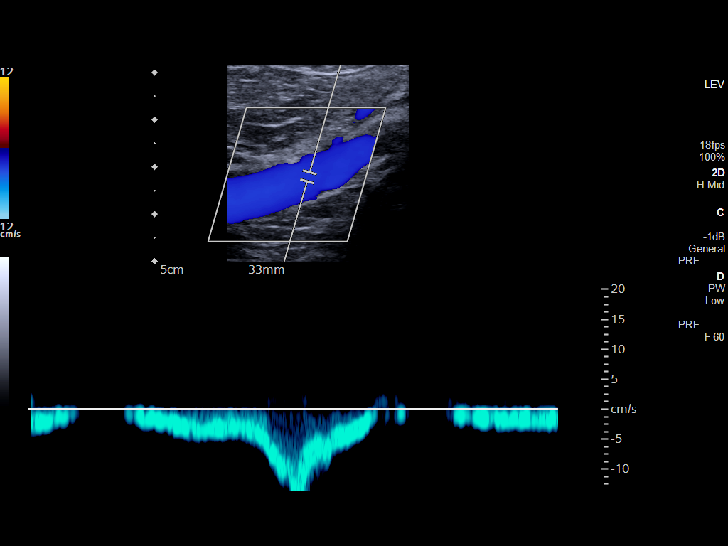
[im 48/59]
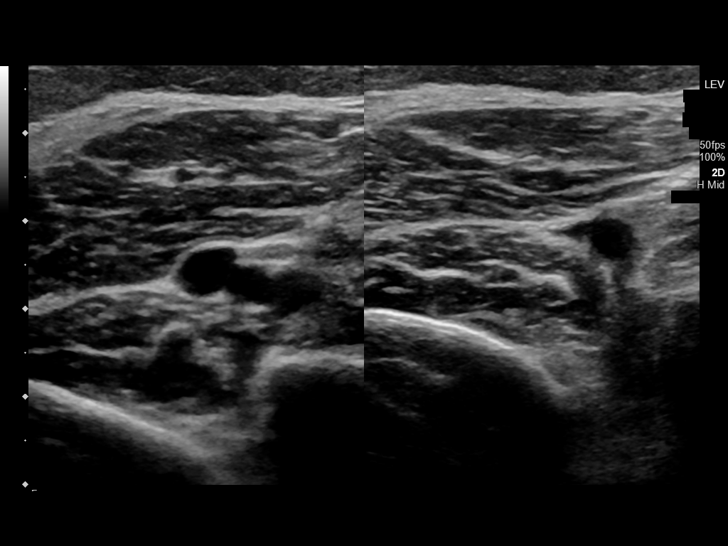
[im 53/59]
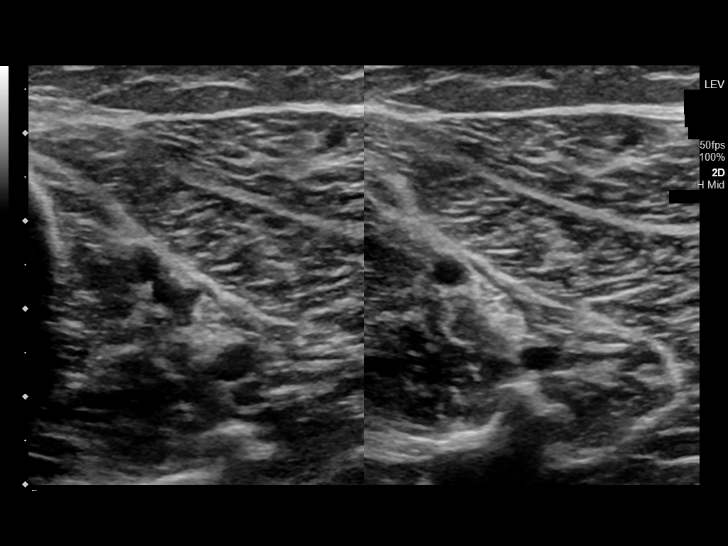
[im 59/59]
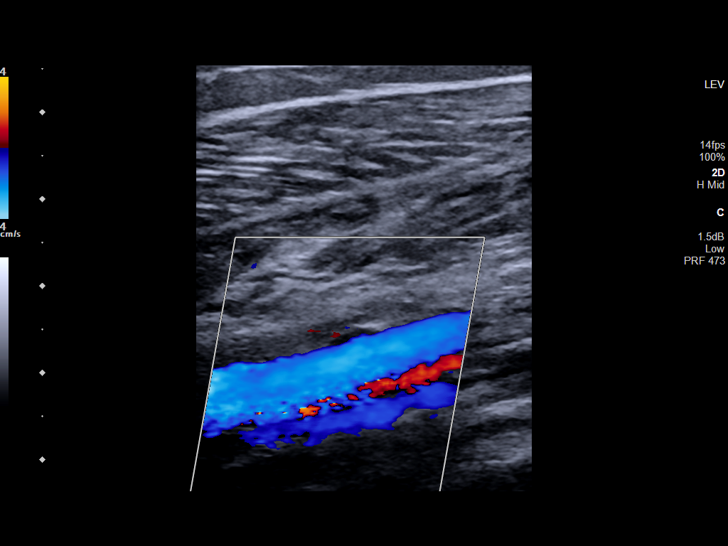

[14 of 24 positions shown; findings below may reference images not displayed]

FINDINGS: VENOUS

Normal compressibility of the common femoral, superficial femoral,
and popliteal veins, as well as the visualized calf veins.
Visualized portions of profunda femoral vein and great saphenous
vein unremarkable. No filling defects to suggest DVT on grayscale or
color Doppler imaging. Doppler waveforms show normal direction of
venous flow, normal respiratory plasticity and response to
augmentation.

Limited views of the contralateral common femoral vein are
unremarkable.

OTHER

None.

Limitations: none
IMPRESSION: Negative examination for deep venous thrombosis in the right lower
extremity.

## 2023-09-18 ENCOUNTER — Ambulatory Visit: Admitting: Obstetrics and Gynecology

## 2023-09-18 DIAGNOSIS — Z3041 Encounter for surveillance of contraceptive pills: Secondary | ICD-10-CM

## 2023-09-18 DIAGNOSIS — Z01419 Encounter for gynecological examination (general) (routine) without abnormal findings: Secondary | ICD-10-CM

## 2023-09-18 DIAGNOSIS — Z124 Encounter for screening for malignant neoplasm of cervix: Secondary | ICD-10-CM

## 2023-09-23 ENCOUNTER — Encounter: Payer: Self-pay | Admitting: Obstetrics and Gynecology

## 2023-09-24 ENCOUNTER — Encounter

## 2023-10-17 ENCOUNTER — Encounter

## 2023-11-24 ENCOUNTER — Other Ambulatory Visit: Payer: Self-pay | Admitting: Obstetrics and Gynecology

## 2023-11-24 ENCOUNTER — Other Ambulatory Visit: Payer: Self-pay

## 2023-11-24 DIAGNOSIS — Z3041 Encounter for surveillance of contraceptive pills: Secondary | ICD-10-CM

## 2023-11-24 MED ORDER — NORETHINDRONE ACET-ETHINYL EST 1-20 MG-MCG PO TABS: 1.0000 | ORAL_TABLET | Freq: Every day | ORAL | 0 refills | Status: DC

## 2023-12-18 ENCOUNTER — Ambulatory Visit: Admitting: Obstetrics and Gynecology

## 2023-12-23 ENCOUNTER — Other Ambulatory Visit (HOSPITAL_COMMUNITY)
Admission: RE | Admit: 2023-12-23 | Discharge: 2023-12-23 | Disposition: A | Discharge status: Home / Self Care | Source: Ambulatory Visit | Attending: Obstetrics and Gynecology | Admitting: Obstetrics and Gynecology

## 2023-12-23 ENCOUNTER — Encounter: Payer: Self-pay | Admitting: Obstetrics and Gynecology

## 2023-12-23 ENCOUNTER — Ambulatory Visit: Admitting: Obstetrics and Gynecology

## 2023-12-23 VITALS — BP 112/78 | HR 102 | Ht 63.0 in

## 2023-12-23 DIAGNOSIS — Z3041 Encounter for surveillance of contraceptive pills: Secondary | ICD-10-CM | POA: Diagnosis not present

## 2023-12-23 DIAGNOSIS — Z124 Encounter for screening for malignant neoplasm of cervix: Secondary | ICD-10-CM

## 2023-12-23 DIAGNOSIS — Z01419 Encounter for gynecological examination (general) (routine) without abnormal findings: Secondary | ICD-10-CM

## 2023-12-23 DIAGNOSIS — Z1272 Encounter for screening for malignant neoplasm of vagina: Secondary | ICD-10-CM | POA: Diagnosis not present

## 2023-12-23 MED ORDER — LEVONORGEST-ETH ESTRAD 91-DAY 0.15-0.03 &0.01 MG PO TABS: 1.0000 | ORAL_TABLET | Freq: Every day | ORAL | 3 refills | Status: AC

## 2023-12-23 NOTE — Progress Notes (Signed)
 Patients presents for annual exam today. She states spotting while taking continuous OCP's, has not had this happen before. Due for pap smear, ordered. Due for mammogram, PCP ordered. Annual labs are up to date. She states no other questions or concerns at this time.

## 2023-12-23 NOTE — Progress Notes (Signed)
 HPI:      [name] is a 48 y.o. G2P2 who LMP was No LMP recorded. (Menstrual status: Oral contraceptives).  Subjective:   She presents today for her annual examination.  States that she is generally doing well.  She has been taking OCPs in a continuous manner without menses.  Recently she has begun to have multiple episodes of breakthrough bleeding.  She would like to know what she can do to resolve this bleeding.    Hx: The following portions of the patient's history were reviewed and updated as appropriate:             She  has a past medical history of ADD (attention deficit disorder), Allergy, Anxiety, Asthma, and Depression. She does not have any pertinent problems on file. She  has a past surgical history that includes Cholecystectomy; Cholecystectomy; and Intramedullary (im) nail intertrochanteric (Left, 04/12/2023). Her family history is not on file. She was adopted. She  reports that she has never smoked. She has never used smokeless tobacco. She reports current alcohol use. She reports that she does not use drugs. She has a current medication list which includes the following prescription(s): aripiprazole , aspirin  ec, clonazepam , fluoxetine , fluticasone , levonorgestrel -ethinyl estradiol, multiple vitamin, fish oil, polyethylene glycol powder, topiramate , trazodone , ascorbic acid, and [DISCONTINUED] bupropion . She is allergic to morphine.       Review of Systems:  Review of Systems  Constitutional: Denied constitutional symptoms, night sweats, recent illness, fatigue, fever, insomnia and weight loss.  Eyes: Denied eye symptoms, eye pain, photophobia, vision change and visual disturbance.  Ears/Nose/Throat/Neck: Denied ear, nose, throat or neck symptoms, hearing loss, nasal discharge, sinus congestion and sore throat.  Cardiovascular: Denied cardiovascular symptoms, arrhythmia, chest pain/pressure, edema, exercise intolerance, orthopnea and palpitations.   Respiratory: Denied pulmonary symptoms, asthma, pleuritic pain, productive sputum, cough, dyspnea and wheezing.  Gastrointestinal: Denied, gastro-esophageal reflux, melena, nausea and vomiting.  Genitourinary: See HPI for additional information.  Musculoskeletal: Denied musculoskeletal symptoms, stiffness, swelling, muscle weakness and myalgia.  Dermatologic: Denied dermatology symptoms, rash and scar.  Neurologic: Denied neurology symptoms, dizziness, headache, neck pain and syncope.  Psychiatric: Denied psychiatric symptoms, anxiety and depression.  Endocrine: Denied endocrine symptoms including hot flashes and night sweats.   Meds:   Current Outpatient Medications on File Prior to Visit  Medication Sig Dispense Refill   ARIPiprazole  (ABILIFY ) 5 MG tablet Take by mouth.     aspirin  EC 325 MG tablet Take 1 tablet (325 mg total) by mouth daily.     clonazePAM  (KLONOPIN ) 1 MG tablet SMARTSIG:1 Tablet(s) By Mouth Every 12 Hours PRN     FLUoxetine  (PROZAC ) 40 MG capsule Take 40 mg by mouth daily.     fluticasone  (FLONASE ) 50 MCG/ACT nasal spray TWO PUFFS IN EACH NOSTRIL ONCE A DAY 16 g 1   Multiple Vitamins-Minerals (MULTIVITAMIN PO) Take 1 tablet by mouth daily.     Omega-3 Fatty Acids (FISH OIL) 1000 MG CAPS Take 1 capsule by mouth 2 (two) times daily.     polyethylene glycol powder (GLYCOLAX /MIRALAX ) 17 GM/SCOOP powder Take by mouth.     topiramate  (TOPAMAX ) 50 MG tablet Take 50 mg by mouth 2 (two) times daily.     traZODone  (DESYREL ) 100 MG tablet      vitamin C (ASCORBIC ACID) 500 MG tablet Take 500 mg by mouth 2 (two) times daily.     [DISCONTINUED] buPROPion  (WELLBUTRIN  XL) 150 MG 24 hr tablet Take 1 tablet (150 mg total) by mouth daily. 30  tablet 2   No current facility-administered medications on file prior to visit.     Objective:     Vitals:   12/23/23 1451  BP: 112/78  Pulse: (!) 102    There were no vitals filed for this visit.            Physical  examination General NAD, Conversant  HEENT Atraumatic; Op clear with mmm.  Normo-cephalic.  Anicteric sclerae  Thyroid /Neck Smooth without nodularity or enlargement. Normal ROM.  Neck Supple.  Skin No rashes, lesions or ulceration. Normal palpated skin turgor. No nodularity.  Breasts: No masses or discharge.  Symmetric.  No axillary adenopathy.  Lungs: Clear to auscultation.No rales or wheezes. Normal Respiratory effort, no retractions.  Heart: NSR.  No murmurs or rubs appreciated. No peripheral edema  Abdomen: Soft.  Non-tender.  No masses.  No HSM. No hernia  Extremities: Moves all appropriately.  Normal ROM for age. No lymphadenopathy.  Neuro: Oriented to PPT.  Normal mood. Normal affect.     Pelvic:   Vulva: Normal appearance.  No lesions.  Vagina: No lesions or abnormalities noted.  Support: Normal pelvic support.  Urethra No masses tenderness or scarring.  Meatus Normal size without lesions or prolapse.  Cervix: Normal appearance.  No lesions.  Anus: Normal exam.  No lesions.  Perineum: Normal exam.  No lesions.        Bimanual   Uterus: Normal size.  Non-tender.  Mobile.  AV.  Adnexae: No masses.  Non-tender to palpation.  Cul-de-sac: Negative for abnormality.     Assessment:    G2P2 Patient Active Problem List   Diagnosis Date Noted   Femur fracture (HCC) 04/11/2023   Chest pain 10/20/2013   Allergic rhinitis 09/28/2013   Positive urine drug screen 08/16/2013   ADD (attention deficit disorder) 02/23/2013   Migraine with aura 01/13/2013   Anxiety and depression    Allergy      1. Well woman exam with routine gynecological exam   2. Surveillance for birth control, oral contraceptives   3. Cervical cancer screening     Some breakthrough bleeding with continuous OCPs.  This has been a recent development for her.  She would like the bleeding to stop.   Plan:            1.  Basic Screening Recommendations The basic screening recommendations for asymptomatic  women were discussed with the patient during her visit.  The age-appropriate recommendations were discussed with her and the rational for the tests reviewed.  When I am informed by the patient that another primary care physician has previously obtained the age-appropriate tests and they are up-to-date, only outstanding tests are ordered and referrals given as necessary.  Abnormal results of tests will be discussed with her when all of her results are completed.  Routine preventative health maintenance measures emphasized: Exercise/Diet/Weight control, Tobacco Warnings, Alcohol/Substance use risks and Stress Management Pap performed-mammogram ordered 2.  Discussed multiple options for treatment of breakthrough bleeding.  She has chosen to change pills to Seasonique.  We discussed switching pills after taking a week off for her menstrual period.  Orders No orders of the defined types were placed in this encounter.    Meds ordered this encounter  Medications   Levonorgestrel -Ethinyl Estradiol (AMETHIA) 0.15-0.03 &0.01 MG tablet    Sig: Take 1 tablet by mouth at bedtime.    Dispense:  84 tablet    Refill:  3         F/U  Return in about 1 year (around 12/22/2024) for Annual Physical, Pt to contact us  if symptoms worsen.  Alm DOROTHA Sar, M.D. 12/23/2023 3:48 PM

## 2023-12-30 LAB — CYTOLOGY - PAP
Comment: NEGATIVE
High risk HPV: POSITIVE — AB

## 2024-01-01 ENCOUNTER — Ambulatory Visit: Payer: Self-pay

## 2024-01-08 ENCOUNTER — Telehealth: Admitting: Physician Assistant

## 2024-01-08 DIAGNOSIS — R062 Wheezing: Secondary | ICD-10-CM

## 2024-01-08 DIAGNOSIS — R112 Nausea with vomiting, unspecified: Secondary | ICD-10-CM

## 2024-01-08 NOTE — Progress Notes (Signed)

## 2024-01-12 IMAGING — CT CT RENAL STONE PROTOCOL
2 of 4 series · 16 of 46 positions shown, 18 images · non-contrast
Comparison: 02/10/2017

CLINICAL DATA: Left flank pain



[Series 2: stone full standard · axial · 0.77mm/px · z∈[-1028,-608]mm · 13 of 94 slices shown, 15 images]
[im 5/94  soft-tissue]
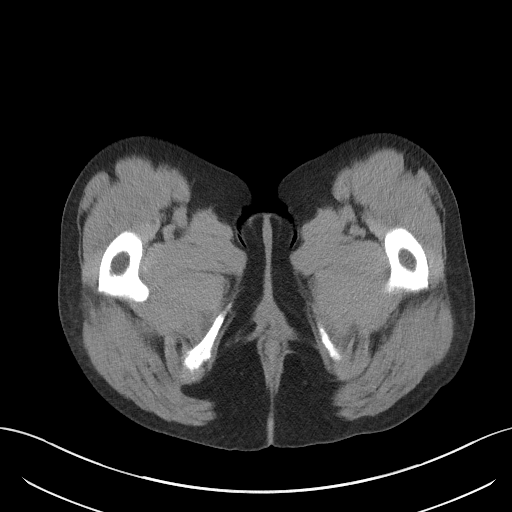
[im 5/94  bone]
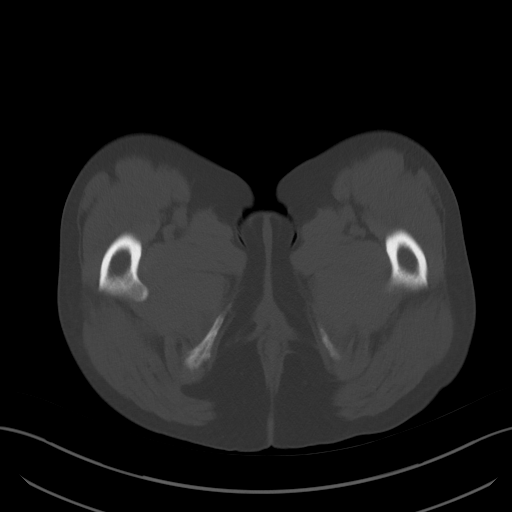
[im 13/94  soft-tissue]
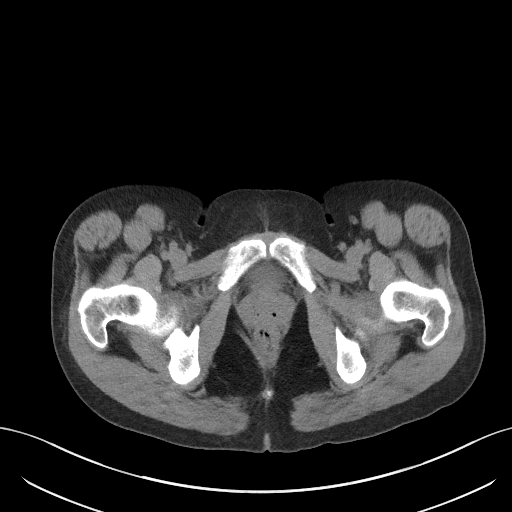
[im 21/94  soft-tissue]
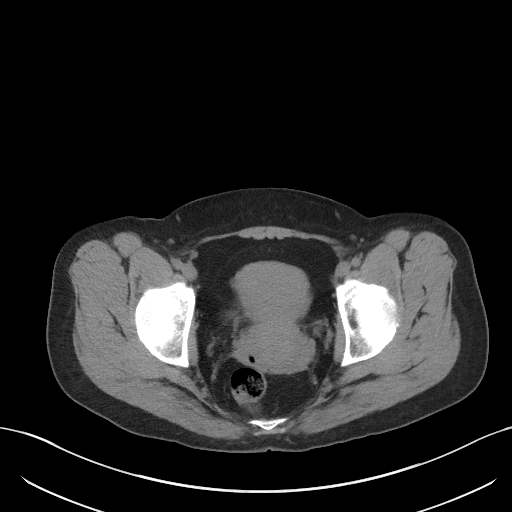
[im 25/94  soft-tissue]
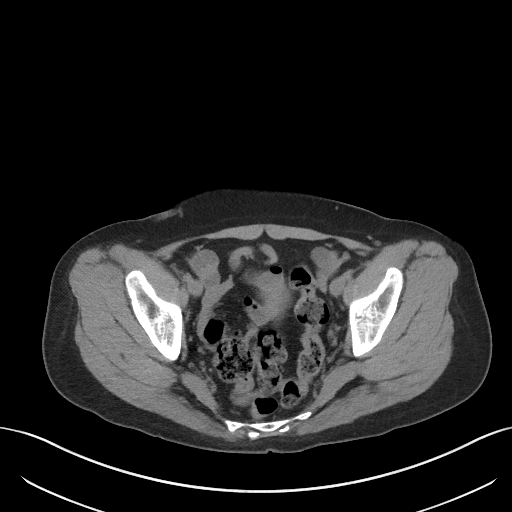
[im 33/94  soft-tissue]
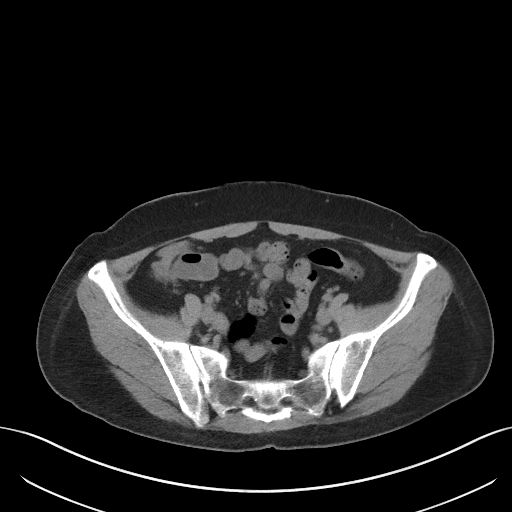
[im 41/94  soft-tissue]
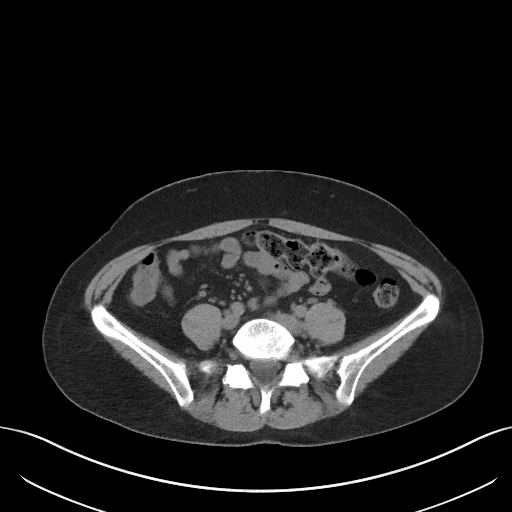
[im 49/94  soft-tissue]
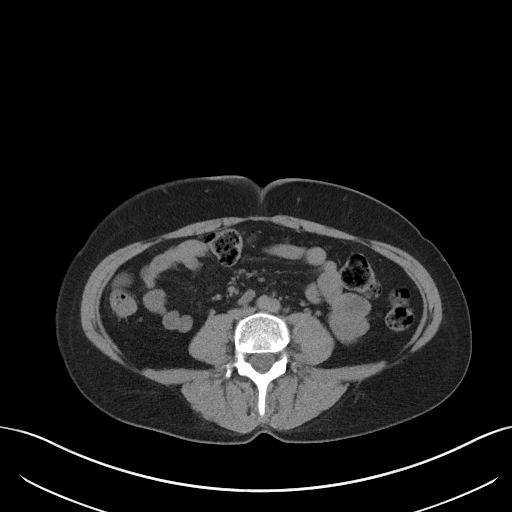
[im 53/94  soft-tissue]
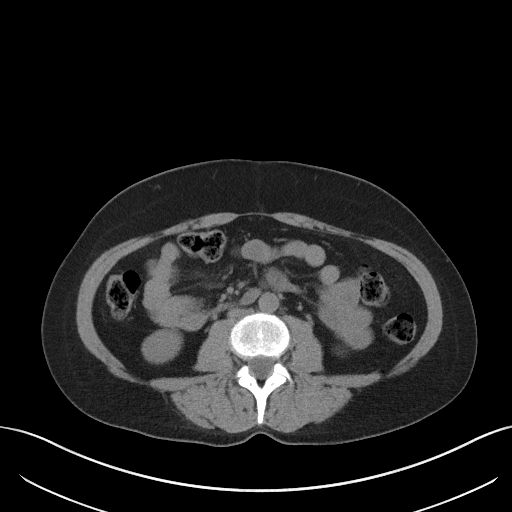
[im 61/94  soft-tissue]
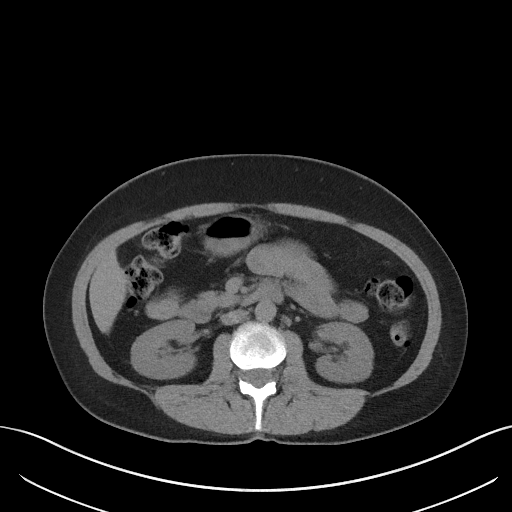
[im 61/94  bone]
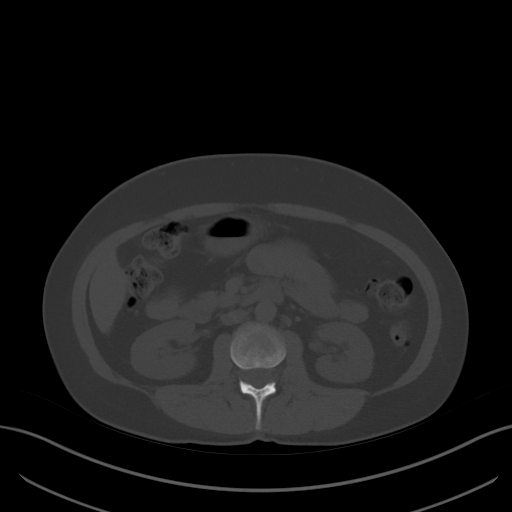
[im 69/94  soft-tissue]
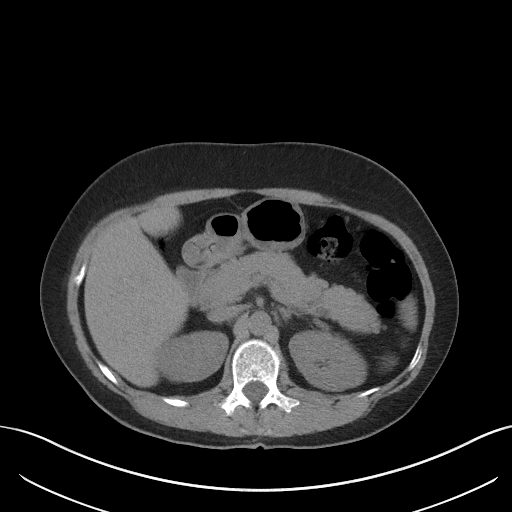
[im 73/94  soft-tissue]
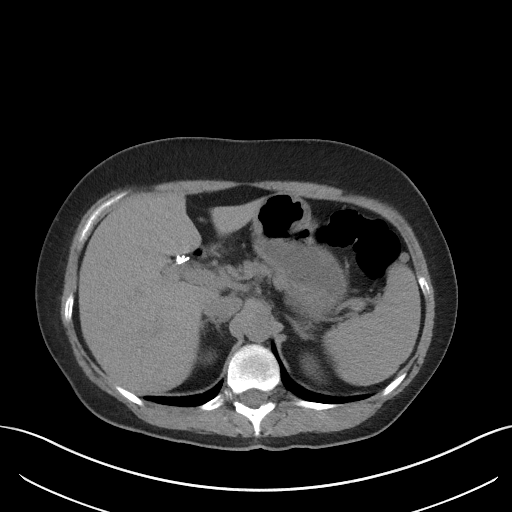
[im 81/94  soft-tissue]
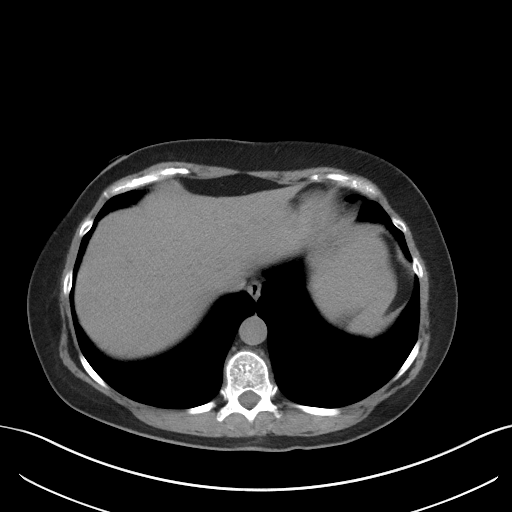
[im 89/94  soft-tissue]
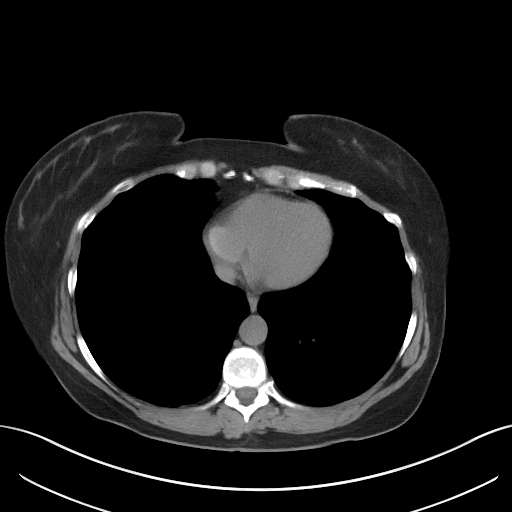

[Series 5: coronal · coronal · 0.67mm/px · 3 of 121 slices shown]
[im 41/121  soft-tissue]
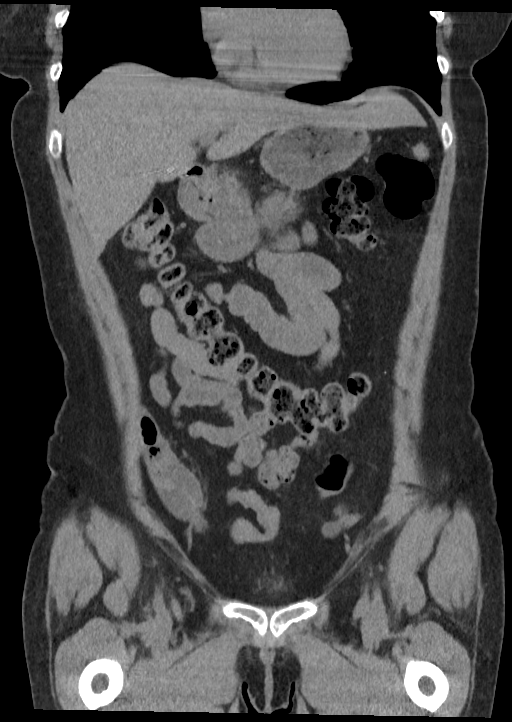
[im 54/121  soft-tissue]
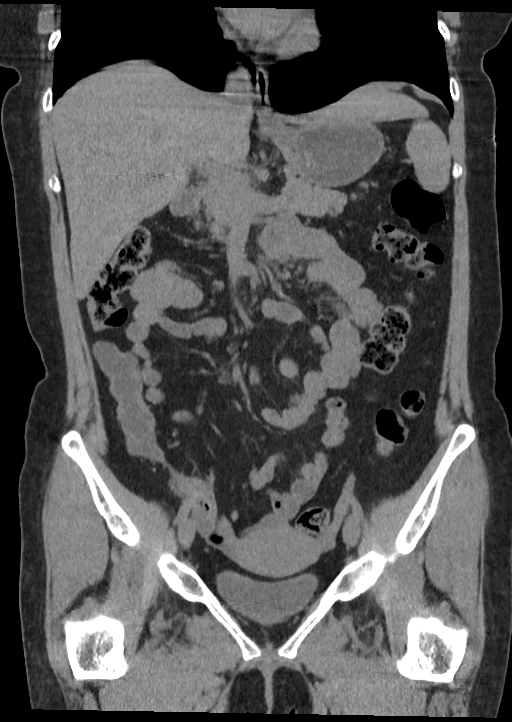
[im 67/121  soft-tissue]
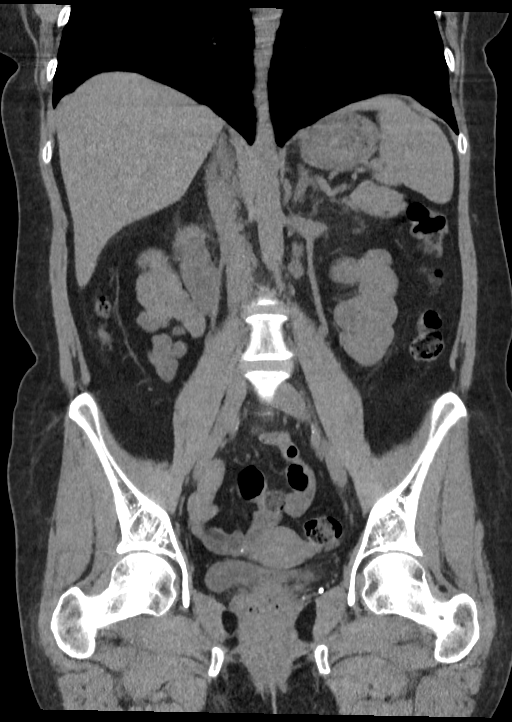

[16 of 46 positions shown; findings below may reference images not displayed]

FINDINGS: Lower chest: No acute abnormality

Hepatobiliary: No focal liver abnormality is seen. Status post
cholecystectomy. No biliary dilatation.

Pancreas: No focal abnormality or ductal dilatation.

Spleen: No focal abnormality.  Normal size.

Adrenals/Urinary Tract: 2 mm stone layering dependently in the
bladder. No ureteral stones or hydronephrosis. No renal stones. No
renal or adrenal mass.

Stomach/Bowel: Normal appendix. Stomach, large and small bowel
grossly unremarkable.

Vascular/Lymphatic: No evidence of aneurysm or adenopathy.

Reproductive: Uterus and adnexa unremarkable.  No mass.

Other: No free fluid or free air.

Musculoskeletal: No acute bony abnormality.
IMPRESSION: 2 mm stone layering dependently within the urinary bladder. No
ureteral stones or hydronephrosis.

No acute findings.

## 2024-02-02 ENCOUNTER — Telehealth: Payer: Self-pay | Admitting: Physician Assistant

## 2024-02-02 DIAGNOSIS — J069 Acute upper respiratory infection, unspecified: Secondary | ICD-10-CM

## 2024-02-03 MED ORDER — IPRATROPIUM BROMIDE 0.03 % NA SOLN: 2.0000 | Freq: Two times a day (BID) | NASAL | 0 refills | Status: AC

## 2024-02-03 NOTE — Progress Notes (Signed)
 E-Visit for Upper Respiratory Infection  ? ?We are sorry you are not feeling well.  Here is how we plan to help! ? ?Based on what you have shared with me, it looks like you may have a viral upper respiratory infection.  Upper respiratory infections are caused by a large number of viruses; however, rhinovirus is the most common cause.  ? ?Symptoms vary from person to person, with common symptoms including sore throat, cough, fatigue or lack of energy and feeling of general discomfort.  A low-grade fever of up to 100.4 may present, but is often uncommon.  Symptoms vary however, and are closely related to a person's age or underlying illnesses.  The most common symptoms associated with an upper respiratory infection are nasal discharge or congestion, cough, sneezing, headache and pressure in the ears and face.  These symptoms usually persist for about 3 to 10 days, but can last up to 2 weeks.  It is important to know that upper respiratory infections do not cause serious illness or complications in most cases.   ? ?Upper respiratory infections can be transmitted from person to person, with the most common method of transmission being a person's hands.  The virus is able to live on the skin and can infect other persons for up to 2 hours after direct contact.  Also, these can be transmitted when someone coughs or sneezes; thus, it is important to cover the mouth to reduce this risk.  To keep the spread of the illness at bay, good hand hygiene is very important. ? ?This is an infection that is most likely caused by a virus. There are no specific treatments other than to help you with the symptoms until the infection runs its course.  We are sorry you are not feeling well.  Here is how we plan to help! ? ? ?For nasal congestion, you may use an oral decongestants such as Mucinex D or if you have glaucoma or high blood pressure use plain Mucinex.  Saline nasal spray or nasal drops can help and can safely be used as often as  needed for congestion.  ? ?If you do not have a history of heart disease, hypertension, diabetes or thyroid disease, prostate/bladder issues or glaucoma, you may also use Sudafed to treat nasal congestion.  It is highly recommended that you consult with a pharmacist or your primary care physician to ensure this medication is safe for you to take.    ? ?If you have a cough, you may use cough suppressants such as Delsym and Robitussin.  If you have glaucoma or high blood pressure, you can also use Coricidin HBP.   ? ? ?If you have a sore or scratchy throat, use a saltwater gargle- ? to ? teaspoon of salt dissolved in a 4-ounce to 8-ounce glass of warm water.  Gargle the solution for approximately 15-30 seconds and then spit.  It is important not to swallow the solution.  You can also use throat lozenges/cough drops and Chloraseptic spray to help with throat pain or discomfort.  Warm or cold liquids can also be helpful in relieving throat pain. ? ?For headache, pain or general discomfort, you can use Ibuprofen or Tylenol as directed.   ?Some authorities believe that zinc sprays or the use of Echinacea may shorten the course of your symptoms. ? ? ?HOME CARE ?Only take medications as instructed by your medical team. ?Be sure to drink plenty of fluids. Water is fine as well as fruit juices, sodas  and electrolyte beverages. You may want to stay away from caffeine or alcohol. If you are nauseated, try taking small sips of liquids. How do you know if you are getting enough fluid? Your urine should be a pale yellow or almost colorless. ?Get rest. ?Taking a steamy shower or using a humidifier may help nasal congestion and ease sore throat pain. You can place a towel over your head and breathe in the steam from hot water coming from a faucet. ?Using a saline nasal spray works much the same way. ?Cough drops, hard candies and sore throat lozenges may ease your cough. ?Avoid close contacts especially the very young and the  elderly ?Cover your mouth if you cough or sneeze ?Always remember to wash your hands.  ? ?GET HELP RIGHT AWAY IF: ?You develop worsening fever. ?If your symptoms do not improve within 10 days ?You develop yellow or green discharge from your nose over 3 days. ?You have coughing fits ?You develop a severe head ache or visual changes. ?You develop shortness of breath, difficulty breathing or start having chest pain ?Your symptoms persist after you have completed your treatment plan ? ?MAKE SURE YOU  ?Understand these instructions. ?Will watch your condition. ?Will get help right away if you are not doing well or get worse. ? ?Thank you for choosing an e-visit. ? ?Your e-visit answers were reviewed by a board certified advanced clinical practitioner to complete your personal care plan. Depending upon the condition, your plan could have included both over the counter or prescription medications. ? ?Please review your pharmacy choice. Make sure the pharmacy is open so you can pick up prescription now. If there is a problem, you may contact your provider through [employer] and have the prescription routed to another pharmacy.  Your safety is important to us . If you have drug allergies check your prescription carefully.  ? ?For the next 24 hours you can use MyChart to ask questions about today's visit, request a non-urgent call back, or ask for a work or school excuse. ?You will get an email in the next two days asking about your experience. I hope that your e-visit has been valuable and will speed your recovery. ? ? ? ? ?

## 2024-02-03 NOTE — Progress Notes (Signed)
 I have spent 5 minutes in review of e-visit questionnaire, review and updating patient chart, medical decision making and response to patient.   Elsie Velma Lunger, PA-C

## 2024-02-03 NOTE — Addendum Note (Signed)
 Addended by: GLADIS ELSIE BROCKS on: 02/03/2024 08:02 AM   Modules accepted: Orders

## 2024-02-05 ENCOUNTER — Other Ambulatory Visit: Payer: Self-pay

## 2024-02-05 DIAGNOSIS — Z1231 Encounter for screening mammogram for malignant neoplasm of breast: Secondary | ICD-10-CM

## 2024-02-09 ENCOUNTER — Encounter: Payer: Self-pay | Admitting: Obstetrics

## 2024-03-01 ENCOUNTER — Ambulatory Visit: Payer: Self-pay

## 2024-03-08 ENCOUNTER — Ambulatory Visit: Payer: Self-pay

## 2024-03-08 ENCOUNTER — Encounter: Payer: Self-pay | Admitting: Obstetrics & Gynecology

## 2024-04-19 NOTE — Progress Notes (Unsigned)
 Ms. Tyshia Fenter is a 48 y.o. female who presents to [facility] clinic today with {Blank single:19197::no complaints,complaint of} ***. Patient referred to [facility] by [facility] due to having an abnormal Pap smear on 12/23/2023 that a colposcopy is recommended for follow up.   Pap Smear: Pap smear not completed today. Last Pap smear was 12/23/2023 at [facility] clinic and was abnormal - LSIL with positive HPV. Per patient has {Blank single:19197::no history,history} of an abnormal Pap smear. Last Pap smear result is available in Epic.   Physical exam: Breasts Breasts symmetrical. No skin abnormalities bilateral breasts. No nipple retraction bilateral breasts. No nipple discharge bilateral breasts. No lymphadenopathy. No lumps palpated bilateral breasts.      MM 3D SCREEN BREAST BILATERAL Result Date: 07/31/2022 CLINICAL DATA:  Screening. EXAM: DIGITAL SCREENING BILATERAL MAMMOGRAM WITH TOMOSYNTHESIS AND CAD TECHNIQUE: Bilateral screening digital craniocaudal and mediolateral oblique mammograms were obtained. Bilateral screening digital breast tomosynthesis was performed. The images were evaluated with computer-aided detection. COMPARISON:  Previous exam(s). ACR Breast Density Category b: There are scattered areas of fibroglandular density. FINDINGS: There are no findings suspicious for malignancy. IMPRESSION: No mammographic evidence of malignancy. A result letter of this screening mammogram will be mailed directly to the patient. RECOMMENDATION: Screening mammogram in one year. (Code:SM-B-01Y) BI-RADS CATEGORY  1: Negative. Electronically Signed   By: Almarie Daring M.D.   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 This was found to be concordant by Dr. Rosaline Collet. Pathology  results were discussed with the patient by telephone. The patient reported doing well after the biopsy with tenderness at the site. Post biopsy instructions and care were reviewed and questions were answered. The patient was encouraged to call [facility] for any additional concerns. My direct phone number was provided. The patient was instructed to return for annual screening mammography. Pathology results reported by Hendricks Benders, RN on 02/08/2021. Electronically Signed   By: Rosaline Collet M.D.   On: 02/08/2021 12:37   Result Date: 02/08/2021 CLINICAL DATA:  48 year old female presenting for stereotactic biopsy of a left breast mass. EXAM: LEFT BREAST STEREOTACTIC CORE NEEDLE BIOPSY COMPARISON:  Previous exams. FINDINGS: The patient and I discussed the procedure of stereotactic-guided biopsy including benefits and alternatives. We discussed the high likelihood of a successful procedure. We discussed the risks of the procedure including infection, bleeding, tissue injury, clip migration, and inadequate sampling. Informed written consent was given. The usual time out protocol was performed immediately prior to the procedure. Using sterile technique and 1% Lidocaine as local anesthetic, under stereotactic guidance, a 9 gauge vacuum assisted device was used to perform core needle biopsy of a mass in the upper inner quadrant of the left breast using a superior approach. Lesion quadrant: Upper inner quadrant At the conclusion of the procedure, an X shaped tissue marker clip was deployed into the biopsy cavity. Follow-up 2-view mammogram was performed and dictated separately. IMPRESSION: Stereotactic-guided biopsy of a mass in the upper inner left breast. No apparent complications. Electronically Signed: By: Rosaline Collet M.D. On: 02/07/2021 14:23  MM CLIP PLACEMENT LEFT Result Date: 02/07/2021 CLINICAL DATA:  Post biopsy mammogram of the left breast for clip placement. EXAM: 3D  DIAGNOSTIC LEFT MAMMOGRAM POST STEREOTACTIC BIOPSY COMPARISON:  Previous exam(s). FINDINGS: 3D Mammographic images were obtained  following stereotactic guided biopsy of a mass in the upper inner left breast. The biopsy marking clip is in expected position at the site of biopsy. IMPRESSION: Appropriate positioning of the X shaped biopsy marking clip at the site of biopsy in the upper inner left breast. Final Assessment: Post Procedure Mammograms for Marker Placement Electronically Signed   By: Rosaline Collet M.D.   On: 02/07/2021 14:45  MM DIAG BREAST TOMO UNI LEFT Result Date: 02/03/2021 CLINICAL DATA:  Screening recall for possible left breast asymmetry. EXAM: DIGITAL DIAGNOSTIC UNILATERAL LEFT MAMMOGRAM WITH TOMOSYNTHESIS AND CAD; ULTRASOUND LEFT BREAST LIMITED TECHNIQUE: Left digital diagnostic mammography and breast tomosynthesis was performed. The images were evaluated with computer-aided detection.; Targeted ultrasound examination of the left breast was performed. COMPARISON:  Previous exams. ACR Breast Density Category b: There are scattered areas of fibroglandular density. FINDINGS: Additional tomograms were performed of the left breast. There is a persistent low-density oval mass in the slightly inner left breast with slight margin irregularity measuring 0.5 cm. This is best visualized on the CC tomograms. Targeted ultrasound of the central to upper inner left breast was performed. No definite sonographic correlate seen for the 0.5 cm mass identified in the left breast at mammography. No lymphadenopathy seen in the left axilla. IMPRESSION: Indeterminate 0.5 cm mass in the central to upper inner left breast visualized on mammography only. RECOMMENDATION: Recommend stereotactic guided biopsy of left breast mass. I have discussed the findings and recommendations with the patient. If applicable, a reminder letter will be sent to the patient regarding the next appointment. BI-RADS CATEGORY  4: Suspicious.  Electronically Signed   By: Delon Music M.D.   On: 02/03/2021 10:58  MM 3D SCREEN BREAST BILATERAL Result Date: 01/26/2021 CLINICAL DATA:  Screening. EXAM: DIGITAL SCREENING BILATERAL MAMMOGRAM WITH TOMOSYNTHESIS AND CAD TECHNIQUE: Bilateral screening digital craniocaudal and mediolateral oblique mammograms were obtained. Bilateral screening digital breast tomosynthesis was performed. The images were evaluated with computer-aided detection. COMPARISON:  Previous exam(s). ACR Breast Density Category b: There are scattered areas of fibroglandular density. FINDINGS: In the left breast, a possible asymmetry warrants further evaluation. In the right breast, no findings suspicious for malignancy. IMPRESSION: Further evaluation is suggested for possible asymmetry in the left breast. RECOMMENDATION: Diagnostic mammogram and possibly ultrasound of the left breast. (Code:FI-L-38M) The patient will be contacted regarding the findings, and additional imaging will be scheduled. BI-RADS CATEGORY  0: Incomplete. Need additional imaging evaluation and/or prior mammograms for comparison. Electronically Signed   By: Almarie Daring M.D.   On: 01/26/2021 15:04    Pelvic/Bimanual Pap is not indicated today per BCCCP guidelines.   Smoking History: Patient has {Blank single:19197::never smoked,is a former smoker,is a current smoker at *** packs per day} ***referred to quit line.    Patient Navigation: Patient education provided. Access to services provided for patient through BCCCP program.   Colorectal Cancer Screening: Per patient {Blank single:19197::has had colonoscopy completed on ***,has never had colonoscopy completed} No complaints today.    Breast and Cervical Cancer Risk Assessment: Patient {Blank single:19197::has,does not have} family history of breast cancer, known genetic mutations, or radiation treatment to the chest before age 25. Patient {Blank single:19197::has,does not have}  history of cervical dysplasia, immunocompromised, or DES exposure in-utero.  Risk Assessment   No risk assessment data     A: BCCCP exam without pap smear Complaint of ***  P: Referred patient to the [facility] for a screening mammogram. Appointment scheduled Tuesday, April 20, 2024 at 0920.  Referred patient to [facility] for a colposcopy to follow up for her abnormal Pap smear. Appointment scheduled Tuesday, April 20, 2024 at 1315.  Driscilla Wanda SQUIBB, RN 04/19/2024 3:28 PM

## 2024-04-20 ENCOUNTER — Ambulatory Visit: Payer: Self-pay | Attending: Obstetrics and Gynecology | Admitting: *Deleted

## 2024-04-20 ENCOUNTER — Ambulatory Visit
Admission: RE | Admit: 2024-04-20 | Discharge: 2024-04-20 | Disposition: A | Discharge status: Home / Self Care | Payer: Self-pay | Source: Ambulatory Visit | Attending: Obstetrics and Gynecology | Admitting: Obstetrics and Gynecology

## 2024-04-20 ENCOUNTER — Other Ambulatory Visit (HOSPITAL_COMMUNITY)
Admission: RE | Admit: 2024-04-20 | Discharge: 2024-04-20 | Disposition: A | Discharge status: Home / Self Care | Payer: Self-pay | Source: Ambulatory Visit | Attending: Obstetrics & Gynecology | Admitting: Obstetrics & Gynecology

## 2024-04-20 ENCOUNTER — Ambulatory Visit (INDEPENDENT_AMBULATORY_CARE_PROVIDER_SITE_OTHER): Payer: Self-pay | Admitting: Obstetrics & Gynecology

## 2024-04-20 ENCOUNTER — Other Ambulatory Visit: Payer: Self-pay

## 2024-04-20 ENCOUNTER — Encounter: Payer: Self-pay | Admitting: Obstetrics & Gynecology

## 2024-04-20 VITALS — BP 147/86 | Wt 175.7 lb

## 2024-04-20 VITALS — BP 117/77 | HR 109 | Ht 63.0 in | Wt 176.7 lb

## 2024-04-20 DIAGNOSIS — R87612 Low grade squamous intraepithelial lesion on cytologic smear of cervix (LGSIL): Secondary | ICD-10-CM | POA: Insufficient documentation

## 2024-04-20 DIAGNOSIS — N889 Noninflammatory disorder of cervix uteri, unspecified: Secondary | ICD-10-CM

## 2024-04-20 DIAGNOSIS — B977 Papillomavirus as the cause of diseases classified elsewhere: Secondary | ICD-10-CM | POA: Insufficient documentation

## 2024-04-20 DIAGNOSIS — Z01812 Encounter for preprocedural laboratory examination: Secondary | ICD-10-CM

## 2024-04-20 DIAGNOSIS — Z1211 Encounter for screening for malignant neoplasm of colon: Secondary | ICD-10-CM

## 2024-04-20 DIAGNOSIS — Z1231 Encounter for screening mammogram for malignant neoplasm of breast: Secondary | ICD-10-CM | POA: Insufficient documentation

## 2024-04-20 DIAGNOSIS — Z1239 Encounter for other screening for malignant neoplasm of breast: Secondary | ICD-10-CM

## 2024-04-20 DIAGNOSIS — Z3202 Encounter for pregnancy test, result negative: Secondary | ICD-10-CM

## 2024-04-20 LAB — POCT URINE PREGNANCY: Preg Test, Ur: NEGATIVE

## 2024-04-20 NOTE — Patient Instructions (Signed)
 Explained breast self awareness with Joanna Walker. Patient did not need a Pap smear today due to last Pap smear was 12/23/2023. Explained the colposcopy the recommended follow up for her abnormal Pap smear. Referred patient to [facility] for a colposcopy to follow up for her abnormal Pap smear. Appointment scheduled Tuesday, April 20, 2024 at 1315. Referred patient to the [facility] for a screening mammogram. Appointment scheduled Tuesday, April 20, 2024 at 0920. Patient aware of appointments and will be there. Let patient know [facility] will follow up with her within the next couple weeks with results of her mammogram by letter or phone. Joanna Walker verbalized understanding.  Laren Whaling, Wanda Ship, RN 8:47 AM

## 2024-04-20 NOTE — Progress Notes (Signed)
    GYNECOLOGY PROGRESS NOTE  Subjective:    Patient ID: Joanna Walker, female    DOB: July 14, 1975, 48 y.o.   MRN: [id number]  HPI  Patient is a 48 y.o. G2P2 here for a colpo due to LGSIL + HR HPV pap 12/2023. She reports a h/o a colpo and biopsy in 2021 (benign biopsy).   The following portions of the patient's history were reviewed and updated as appropriate: allergies, current medications, past family history, past medical history, past social history, past surgical history, and problem list.  Review of Systems Pertinent items are noted in HPI.   Objective:   Blood pressure 117/77, pulse (!) 109, height 5' 3 (1.6 m), weight 176 lb 11.2 oz (80.2 kg). Body mass index is 31.3 kg/m. Well nourished, well hydrated White female, no apparent distress She is ambulating and conversing normally. UPT negative, consent signed, time out done Speculum placed. Cervix prepped with acetic acid. Transformation zone seen in its entirety. Colpo adequate. Colposcopic findings normal. ECC obtained. She tolerated the procedure well.    Assessment:   1. LGSIL on Pap smear of cervix   2. HPV (human papilloma virus) infection   3. Pre-procedure lab exam      Plan:   1. LGSIL on Pap smear of cervix (Primary) - await pathology - Surgical pathology - If ECC is benign, then pap in a year. If abnormal rec LEEP. We discussed this procedure.  2. HPV (human papilloma virus) infection  - Surgical pathology  3. Pre-procedure lab exam  - POCT urine pregnancy

## 2024-04-22 LAB — SURGICAL PATHOLOGY

## 2024-04-26 ENCOUNTER — Ambulatory Visit: Payer: Self-pay | Admitting: Obstetrics & Gynecology
